# Patient Record
Sex: Female | Born: 1965 | Race: White | Hispanic: No | Marital: Single | Smoking: Current every day smoker
Health system: Southern US, Community
[De-identification: ages and names within clinical notes are randomized; demographics above are authoritative.]

## PROBLEM LIST (undated history)

## (undated) DIAGNOSIS — M7989 Other specified soft tissue disorders: Secondary | ICD-10-CM

## (undated) DIAGNOSIS — L309 Dermatitis, unspecified: Secondary | ICD-10-CM

## (undated) DIAGNOSIS — F329 Major depressive disorder, single episode, unspecified: Secondary | ICD-10-CM

## (undated) DIAGNOSIS — J40 Bronchitis, not specified as acute or chronic: Secondary | ICD-10-CM

## (undated) DIAGNOSIS — F17200 Nicotine dependence, unspecified, uncomplicated: Secondary | ICD-10-CM

## (undated) DIAGNOSIS — F4310 Post-traumatic stress disorder, unspecified: Secondary | ICD-10-CM

## (undated) DIAGNOSIS — J449 Chronic obstructive pulmonary disease, unspecified: Secondary | ICD-10-CM

## (undated) DIAGNOSIS — F1190 Opioid use, unspecified, uncomplicated: Secondary | ICD-10-CM

## (undated) DIAGNOSIS — I10 Essential (primary) hypertension: Secondary | ICD-10-CM

## (undated) HISTORY — PX: BUNIONECTOMY: SHX129

## (undated) HISTORY — PX: APPENDECTOMY: SHX54

---

## 2008-08-03 MED ORDER — OXYCODONE-ACETAMINOPHEN 10 MG-325 MG TAB
10-325 mg | ORAL_TABLET | Freq: Four times a day (QID) | ORAL | Status: DC | PRN
Start: 2008-08-03 — End: 2016-09-03

## 2008-08-03 MED ORDER — HYDROCODONE-ACETAMINOPHEN 7.5 MG-325 MG TAB
ORAL_TABLET | Freq: Four times a day (QID) | ORAL | Status: AC | PRN
Start: 2008-08-03 — End: ?

## 2008-08-03 NOTE — Progress Notes (Signed)
Pt here for left foot pain.  Jacqueline Monroe
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
## 2010-05-18 ENCOUNTER — Emergency Department (HOSPITAL_COMMUNITY)
Admission: EM | Admit: 2010-05-18 | Discharge: 2010-05-18 | Disposition: A | Discharge status: Home / Self Care | Payer: Self-pay | Attending: Emergency Medicine | Admitting: Emergency Medicine

## 2010-05-18 DIAGNOSIS — K029 Dental caries, unspecified: Secondary | ICD-10-CM | POA: Insufficient documentation

## 2010-05-18 DIAGNOSIS — K089 Disorder of teeth and supporting structures, unspecified: Secondary | ICD-10-CM | POA: Insufficient documentation

## 2010-11-02 ENCOUNTER — Other Ambulatory Visit (HOSPITAL_COMMUNITY): Payer: Self-pay | Admitting: Family Medicine

## 2010-11-02 DIAGNOSIS — Z1231 Encounter for screening mammogram for malignant neoplasm of breast: Secondary | ICD-10-CM

## 2010-11-09 ENCOUNTER — Ambulatory Visit (HOSPITAL_COMMUNITY): Payer: Self-pay

## 2010-11-15 ENCOUNTER — Emergency Department (HOSPITAL_COMMUNITY)
Admission: EM | Admit: 2010-11-15 | Discharge: 2010-11-15 | Disposition: A | Discharge status: Home / Self Care | Payer: Self-pay | Attending: Emergency Medicine | Admitting: Emergency Medicine

## 2010-11-15 DIAGNOSIS — A499 Bacterial infection, unspecified: Secondary | ICD-10-CM | POA: Insufficient documentation

## 2010-11-15 DIAGNOSIS — Z79899 Other long term (current) drug therapy: Secondary | ICD-10-CM | POA: Insufficient documentation

## 2010-11-15 DIAGNOSIS — B9689 Other specified bacterial agents as the cause of diseases classified elsewhere: Secondary | ICD-10-CM | POA: Insufficient documentation

## 2010-11-15 DIAGNOSIS — N760 Acute vaginitis: Secondary | ICD-10-CM | POA: Insufficient documentation

## 2010-11-15 DIAGNOSIS — R109 Unspecified abdominal pain: Secondary | ICD-10-CM | POA: Insufficient documentation

## 2010-11-15 LAB — POCT PREGNANCY, URINE: Preg Test, Ur: NEGATIVE

## 2010-11-15 LAB — WET PREP, GENITAL

## 2010-11-15 LAB — URINALYSIS, ROUTINE W REFLEX MICROSCOPIC
Bilirubin Urine: NEGATIVE
Glucose, UA: NEGATIVE mg/dL
Ketones, ur: NEGATIVE mg/dL
pH: 6 (ref 5.0–8.0)

## 2010-11-16 LAB — GC/CHLAMYDIA PROBE AMP, GENITAL
Chlamydia, DNA Probe: NEGATIVE
GC Probe Amp, Genital: NEGATIVE

## 2010-11-23 ENCOUNTER — Ambulatory Visit (HOSPITAL_COMMUNITY)
Admission: RE | Admit: 2010-11-23 | Discharge: 2010-11-23 | Disposition: A | Discharge status: Home / Self Care | Payer: Self-pay | Source: Ambulatory Visit | Attending: Family Medicine | Admitting: Family Medicine

## 2010-11-23 DIAGNOSIS — Z1231 Encounter for screening mammogram for malignant neoplasm of breast: Secondary | ICD-10-CM

## 2011-03-03 ENCOUNTER — Other Ambulatory Visit: Payer: Self-pay

## 2011-03-03 ENCOUNTER — Emergency Department (HOSPITAL_COMMUNITY)
Admission: EM | Admit: 2011-03-03 | Discharge: 2011-03-03 | Disposition: A | Discharge status: Other Acute Inpatient Hospital | Payer: Self-pay | Attending: Emergency Medicine | Admitting: Emergency Medicine

## 2011-03-03 ENCOUNTER — Encounter (HOSPITAL_COMMUNITY): Payer: Self-pay | Admitting: *Deleted

## 2011-03-03 DIAGNOSIS — F29 Unspecified psychosis not due to a substance or known physiological condition: Secondary | ICD-10-CM | POA: Insufficient documentation

## 2011-03-03 DIAGNOSIS — F4310 Post-traumatic stress disorder, unspecified: Secondary | ICD-10-CM | POA: Insufficient documentation

## 2011-03-03 DIAGNOSIS — J45909 Unspecified asthma, uncomplicated: Secondary | ICD-10-CM | POA: Insufficient documentation

## 2011-03-03 DIAGNOSIS — Z79899 Other long term (current) drug therapy: Secondary | ICD-10-CM | POA: Insufficient documentation

## 2011-03-03 DIAGNOSIS — F341 Dysthymic disorder: Secondary | ICD-10-CM | POA: Insufficient documentation

## 2011-03-03 HISTORY — DX: Post-traumatic stress disorder, unspecified: F43.10

## 2011-03-03 HISTORY — DX: Major depressive disorder, single episode, unspecified: F32.9

## 2011-03-03 LAB — COMPREHENSIVE METABOLIC PANEL WITH GFR
ALT: 24 U/L (ref 0–35)
AST: 30 U/L (ref 0–37)
Calcium: 9.2 mg/dL (ref 8.4–10.5)
Creatinine, Ser: 0.92 mg/dL (ref 0.50–1.10)
GFR calc Af Amer: 85 mL/min — ABNORMAL LOW (ref >90)
Sodium: 136 meq/L (ref 135–145)
Total Protein: 7.5 g/dL (ref 6.0–8.3)

## 2011-03-03 LAB — CBC
HCT: 32.8 % — ABNORMAL LOW (ref 36.0–46.0)
Hemoglobin: 11.1 g/dL — ABNORMAL LOW (ref 12.0–15.0)
MCH: 29.7 pg (ref 26.0–34.0)
MCHC: 33.8 g/dL (ref 30.0–36.0)
MCV: 87.7 fL (ref 78.0–100.0)
Platelets: 321 K/uL (ref 150–400)
RBC: 3.74 MIL/uL — ABNORMAL LOW (ref 3.87–5.11)
RDW: 13.9 % (ref 11.5–15.5)
WBC: 6.7 K/uL (ref 4.0–10.5)

## 2011-03-03 LAB — URINALYSIS, ROUTINE W REFLEX MICROSCOPIC
Glucose, UA: NEGATIVE mg/dL
Hgb urine dipstick: NEGATIVE
Ketones, ur: NEGATIVE mg/dL
Leukocytes, UA: NEGATIVE
Nitrite: NEGATIVE
Protein, ur: NEGATIVE mg/dL
Specific Gravity, Urine: 1.018 (ref 1.005–1.030)
Urobilinogen, UA: 0.2 mg/dL (ref 0.0–1.0)
pH: 6 (ref 5.0–8.0)

## 2011-03-03 LAB — DIFFERENTIAL
Basophils Absolute: 0 10*3/uL (ref 0.0–0.1)
Basophils Relative: 1 % (ref 0–1)
Eosinophils Absolute: 0.2 K/uL (ref 0.0–0.7)
Eosinophils Relative: 4 % (ref 0–5)
Lymphocytes Relative: 24 % (ref 12–46)
Lymphs Abs: 1.6 K/uL (ref 0.7–4.0)
Monocytes Absolute: 0.6 10*3/uL (ref 0.1–1.0)
Monocytes Relative: 9 % (ref 3–12)
Neutro Abs: 4.3 K/uL (ref 1.7–7.7)
Neutrophils Relative %: 63 % (ref 43–77)

## 2011-03-03 LAB — ETHANOL: Alcohol, Ethyl (B): <11 mg/dL (ref 0–11)

## 2011-03-03 LAB — RAPID URINE DRUG SCREEN, HOSP PERFORMED
Amphetamines: NOT DETECTED
Barbiturates: NOT DETECTED
Benzodiazepines: POSITIVE — AB
Cocaine: NOT DETECTED
Opiates: NOT DETECTED
Tetrahydrocannabinol: NOT DETECTED

## 2011-03-03 LAB — COMPREHENSIVE METABOLIC PANEL
Albumin: 3.6 g/dL (ref 3.5–5.2)
Alkaline Phosphatase: 91 U/L (ref 39–117)
BUN: 9 mg/dL (ref 6–23)
CO2: 26 mEq/L (ref 19–32)
Chloride: 101 mEq/L (ref 96–112)
GFR calc non Af Amer: 74 mL/min — ABNORMAL LOW (ref >90)
Glucose, Bld: 79 mg/dL (ref 70–99)
Potassium: 3.5 mEq/L (ref 3.5–5.1)
Total Bilirubin: 0.4 mg/dL (ref 0.3–1.2)
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
## 2011-03-03 NOTE — ED Notes (Signed)
Results faxed to [facility] 

## 2011-03-03 NOTE — ED Notes (Signed)
Spoke with Pam @ [facility], pt may return, [place], [place] informed

## 2011-03-03 NOTE — ED Notes (Signed)
Pt states "I was hearing voices yesterday but when the police came, there was no one there"; per [employer], escort with pt, "she was supposed to be brought over last pm"; per IVC papers "she was brought to [facility] by [employer] after making 8 calls to 911 today complaining that she is seeing chopped off heads in her yard, that people are coming in her yard with guns, that people are in her house and leaving when police arrive, she is hearing voices telling her she is terrible and ugly."

## 2011-03-07 NOTE — ED Provider Notes (Signed)
Medical screening examination/treatment/procedure(s) were performed by non-physician practitioner and as supervising physician I was immediately available for consultation/collaboration. Faigy Stretch Y.   Gavin Pound. Conrado Nance, MD 03/07/11 1810

## 2011-04-04 ENCOUNTER — Ambulatory Visit: Payer: Self-pay | Admitting: Emergency Medicine

## 2011-12-07 ENCOUNTER — Emergency Department (HOSPITAL_COMMUNITY)
Admission: EM | Admit: 2011-12-07 | Discharge: 2011-12-07 | Disposition: A | Discharge status: Home / Self Care | Payer: Medicaid Other | Attending: Emergency Medicine | Admitting: Emergency Medicine

## 2011-12-07 ENCOUNTER — Emergency Department (HOSPITAL_COMMUNITY): Payer: Medicaid Other

## 2011-12-07 DIAGNOSIS — J45909 Unspecified asthma, uncomplicated: Secondary | ICD-10-CM | POA: Insufficient documentation

## 2011-12-07 DIAGNOSIS — Z79899 Other long term (current) drug therapy: Secondary | ICD-10-CM | POA: Insufficient documentation

## 2011-12-07 DIAGNOSIS — Y92009 Unspecified place in unspecified non-institutional (private) residence as the place of occurrence of the external cause: Secondary | ICD-10-CM | POA: Insufficient documentation

## 2011-12-07 DIAGNOSIS — Y939 Activity, unspecified: Secondary | ICD-10-CM | POA: Insufficient documentation

## 2011-12-07 DIAGNOSIS — F329 Major depressive disorder, single episode, unspecified: Secondary | ICD-10-CM | POA: Insufficient documentation

## 2011-12-07 DIAGNOSIS — S8263XA Displaced fracture of lateral malleolus of unspecified fibula, initial encounter for closed fracture: Secondary | ICD-10-CM | POA: Insufficient documentation

## 2011-12-07 DIAGNOSIS — F4310 Post-traumatic stress disorder, unspecified: Secondary | ICD-10-CM | POA: Insufficient documentation

## 2011-12-07 DIAGNOSIS — F17200 Nicotine dependence, unspecified, uncomplicated: Secondary | ICD-10-CM | POA: Insufficient documentation

## 2011-12-07 DIAGNOSIS — W010XXA Fall on same level from slipping, tripping and stumbling without subsequent striking against object, initial encounter: Secondary | ICD-10-CM | POA: Insufficient documentation

## 2011-12-07 MED ORDER — OXYCODONE-ACETAMINOPHEN 5-325 MG PO TABS: 1.0000 | ORAL_TABLET | Freq: Four times a day (QID) | ORAL | Status: DC | PRN

## 2011-12-07 MED ORDER — OXYCODONE-ACETAMINOPHEN 5-325 MG PO TABS
2.0000 | ORAL_TABLET | Freq: Once | ORAL | Status: AC
  Administered 2011-12-07: 2 via ORAL
  Filled 2011-12-07: qty 2

## 2011-12-07 NOTE — Progress Notes (Signed)
Orthopedic Tech Progress Note Patient Details:  Rebekah Murillo 07-04-1965 [id number]  Ortho Devices Type of Ortho Device: CAM walker;Crutches Ortho Device/Splint Location: RIGHT CAM WALKER , CRUTCHES  Ortho Device/Splint Interventions: Application   Cammer, Mickie Bail 12/07/2011, 11:45 AM

## 2011-12-07 NOTE — ED Notes (Signed)
Pt was walking on wood floors in apartment and cat ran in front of her and she twisted her ankle. This happened last night. Rt ankle is edematous. No bruising noted at this time. Pt pain score 10/10

## 2011-12-07 NOTE — ED Provider Notes (Addendum)
History     CSN: [id number]  Arrival date & time 12/07/11  1004   First MD Initiated Contact with Patient 12/07/11 1005      No chief complaint on file.   (Consider location/radiation/quality/duration/timing/severity/associated sxs/prior treatment) Patient is a 46 y.o. female presenting with ankle pain. The history is provided by the patient.  Ankle Pain  The incident occurred yesterday. The incident occurred at home. The injury mechanism was a fall (twisted her ankle and fell). The pain is present in the right ankle. The quality of the pain is described as throbbing, sharp and aching. The pain is at a severity of 10/10. The pain is severe. The pain has been constant since onset. Associated symptoms include inability to bear weight. Pertinent negatives include no numbness, no muscle weakness and no loss of sensation. The symptoms are aggravated by activity, bearing weight and palpation. She has tried rest for the symptoms. The treatment provided no relief.    Past Medical History  Diagnosis Date  . Major depression   . PTSD (post-traumatic stress disorder)   . Asthma     Past Surgical History  Procedure Date  . Appendectomy     No family history on file.  History  Substance Use Topics  . Smoking status: Current Every Day Smoker -- 2.0 packs/day  . Smokeless tobacco: Not on file  . Alcohol Use: 1.8 oz/week    3 Cans of beer per week    OB History    Grav Para Term Preterm Abortions TAB SAB Ect Mult Living                  Review of Systems  Constitutional: Negative for fever.  Musculoskeletal: Positive for joint swelling.  Neurological: Negative for numbness.  All other systems reviewed and are negative.    Allergies  Penicillins  Home Medications   Current Outpatient Rx  Name  Route  Sig  Dispense  Refill  . TYLENOL PM EXTRA STRENGTH PO   Oral   Take 2 tablets by mouth at bedtime as needed. For sleep.         Marland Kitchen GABAPENTIN 600 MG PO TABS   Oral  Take 600 mg by mouth 3 (three) times daily.         Marland Kitchen HYDROXYZINE PAMOATE 50 MG PO CAPS   Oral   Take 50 mg by mouth 3 (three) times daily as needed. For anxiety.         Marland Kitchen LAMOTRIGINE 100 MG PO TABS   Oral   Take 50 mg by mouth 2 (two) times daily.         . SERTRALINE HCL 100 MG PO TABS   Oral   Take 100 mg by mouth daily.         Marland Kitchen TEMAZEPAM 15 MG PO CAPS   Oral   Take 30 mg by mouth at bedtime. For sleep.           BP 130/82  Pulse 76  Resp 16  SpO2 98%  Physical Exam  Nursing note and vitals reviewed. Constitutional: She is oriented to person, place, and time. She appears well-developed and well-nourished. No distress.  HENT:  Head: Normocephalic and atraumatic.  Eyes: EOM are normal. Pupils are equal, round, and reactive to light.  Musculoskeletal:       Right knee: Normal.       Right ankle: She exhibits decreased range of motion, swelling and ecchymosis. tenderness. Lateral malleolus and medial malleolus tenderness  found.       2+ right sided DP and PT pulses  Neurological: She is alert and oriented to person, place, and time. She has normal strength. No sensory deficit.  Skin: Skin is warm and dry. No rash noted. No erythema.    ED Course  Procedures (including critical care time)  Labs Reviewed - No data to display Dg Ankle Complete Right  12/07/2011  *RADIOLOGY REPORT*  Clinical Data: Lateral pain and swelling, twisted ankle 1 day ago  RIGHT ANKLE - COMPLETE 3+ VIEW  Comparison: None  Findings: Osseous mineralization normal. Transverse fracture lateral malleolus, minimally distracted. Slight obliquity on the lateral view. No definite additional fracture or dislocation. Small spur at the anterior margin of the distal tibial articular surface. Small plantar calcaneal spur. Soft tissue swelling, greatest laterally.  IMPRESSION: Transverse fracture of the lateral malleolus.   Original Report Authenticated By: Ulyses Southward, M.D.      1. Fractured lateral  malleolus       MDM   Patient with mechanical injury last night to her right ankle. She states she was walking and twisted it. It is swollen and tender and she's been unable to walk on it due to pain. No fibular head tenderness, knee pain or other injuries noted. She is neurovascularly intact and has movement and sensation in her distal foot. Plain films pending and patient given pain control  11:01 AM Lateral mall tenderness.  Will place in cam walker and give f/u and crutches and pain control.      Gwyneth Sprout, MD 12/07/11 1102  Gwyneth Sprout, MD 12/07/11 1114

## 2011-12-07 NOTE — ED Notes (Signed)
Pt fell last night and twisted right foot.

## 2011-12-07 NOTE — ED Notes (Signed)
ZOX:WR60<AV> Expected date:12/07/11<BR> Expected time: 9:55 AM<BR> Means of arrival:Ambulance<BR> Comments:<BR> Fall/ankle pain

## 2012-03-16 ENCOUNTER — Emergency Department (HOSPITAL_COMMUNITY)
Admission: EM | Admit: 2012-03-16 | Discharge: 2012-03-16 | Disposition: A | Discharge status: Home / Self Care | Payer: Medicaid Other | Attending: Emergency Medicine | Admitting: Emergency Medicine

## 2012-03-16 ENCOUNTER — Encounter (HOSPITAL_COMMUNITY): Payer: Self-pay | Admitting: Emergency Medicine

## 2012-03-16 DIAGNOSIS — S99919A Unspecified injury of unspecified ankle, initial encounter: Secondary | ICD-10-CM | POA: Insufficient documentation

## 2012-03-16 DIAGNOSIS — R209 Unspecified disturbances of skin sensation: Secondary | ICD-10-CM | POA: Insufficient documentation

## 2012-03-16 DIAGNOSIS — Y929 Unspecified place or not applicable: Secondary | ICD-10-CM | POA: Insufficient documentation

## 2012-03-16 DIAGNOSIS — J45909 Unspecified asthma, uncomplicated: Secondary | ICD-10-CM | POA: Insufficient documentation

## 2012-03-16 DIAGNOSIS — Y939 Activity, unspecified: Secondary | ICD-10-CM | POA: Insufficient documentation

## 2012-03-16 DIAGNOSIS — Z8659 Personal history of other mental and behavioral disorders: Secondary | ICD-10-CM | POA: Insufficient documentation

## 2012-03-16 DIAGNOSIS — G8929 Other chronic pain: Secondary | ICD-10-CM | POA: Insufficient documentation

## 2012-03-16 DIAGNOSIS — S8990XA Unspecified injury of unspecified lower leg, initial encounter: Secondary | ICD-10-CM | POA: Insufficient documentation

## 2012-03-16 DIAGNOSIS — X500XXA Overexertion from strenuous movement or load, initial encounter: Secondary | ICD-10-CM | POA: Insufficient documentation

## 2012-03-16 DIAGNOSIS — W010XXA Fall on same level from slipping, tripping and stumbling without subsequent striking against object, initial encounter: Secondary | ICD-10-CM | POA: Insufficient documentation

## 2012-03-16 DIAGNOSIS — F17200 Nicotine dependence, unspecified, uncomplicated: Secondary | ICD-10-CM | POA: Insufficient documentation

## 2012-03-16 MED ORDER — KETOROLAC TROMETHAMINE 60 MG/2ML IM SOLN
60.0000 mg | Freq: Once | INTRAMUSCULAR | Status: AC
  Administered 2012-03-16: 60 mg via INTRAMUSCULAR
  Filled 2012-03-16: qty 2

## 2012-03-16 MED ORDER — MORPHINE SULFATE 4 MG/ML IJ SOLN
6.0000 mg | Freq: Once | INTRAMUSCULAR | Status: AC
  Administered 2012-03-16: 4 mg via INTRAMUSCULAR
  Filled 2012-03-16: qty 1

## 2012-03-16 MED ORDER — OXYCODONE-ACETAMINOPHEN 5-325 MG PO TABS: 1.0000 | ORAL_TABLET | Freq: Four times a day (QID) | ORAL | Status: DC | PRN

## 2012-03-16 MED ORDER — IBUPROFEN 800 MG PO TABS: 800.0000 mg | ORAL_TABLET | Freq: Three times a day (TID) | ORAL | Status: DC | PRN

## 2012-03-16 NOTE — ED Notes (Signed)
QIO:NG29<BM> Expected date:03/16/12<BR> Expected time: 2:15 PM<BR> Means of arrival:Ambulance<BR> Comments:<BR> Pain in feet

## 2012-03-16 NOTE — ED Notes (Signed)
Pt c/o bilateral feet pain x 3 months from previous breaks.  Denies recent injury.

## 2012-03-16 NOTE — ED Provider Notes (Signed)
History     CSN: [id number]  Arrival date & time 03/16/12  1408   First MD Initiated Contact with Patient 03/16/12 1429      Chief Complaint  Patient presents with  . Foot Pain    (Consider location/radiation/quality/duration/timing/severity/associated sxs/prior treatment) HPI Patient is a 47 yo female with a history of depression who presents today with bilateral foot pain.  Her right ankle was broken three months ago when she tripped over her cat and twisted her ankle.  Since then it's been hurting and she would like to get surgery on it but her orthopedist won't do it until she stops smoking so much.  Her left foot has been hurting for the past 3-4 years with numbness and tingling.  She got imaging done by her orthopedist on that foot and all she was told was that she would have to see another foot doctor for her left foot.  She is unsure if she has a fracture or not.  She is tired of being a mermaid where she is unable to use both of her feet and when she tries to focus on healing on foot than the other one gets messed up.  She smokes two packs a day and would like some help quitting with a patch.  Past Medical History  Diagnosis Date  . Major depression   . PTSD (post-traumatic stress disorder)   . Asthma     Past Surgical History  Procedure Laterality Date  . Appendectomy      No family history on file.  History  Substance Use Topics  . Smoking status: Current Every Day Smoker -- 2.00 packs/day  . Smokeless tobacco: Not on file  . Alcohol Use: 1.8 oz/week    3 Cans of beer per week    OB History   Grav Para Term Preterm Abortions TAB SAB Ect Mult Living                  Review of Systems All other systems negative except as documented in the HPI. All pertinent positives and negatives as reviewed in the HPI.  Allergies  Penicillins  Home Medications  No current outpatient prescriptions on file.  BP 158/94  Pulse 99  Temp(Src) 98.5 F (36.9 C) (Oral)   Resp 18  SpO2 100%  Physical Exam  Nursing note and vitals reviewed. Constitutional: She is oriented to person, place, and time. She appears well-developed and well-nourished. No distress.  HENT:  Head: Normocephalic and atraumatic.  Eyes: Right eye exhibits no discharge. Left eye exhibits no discharge.  Neck: Normal range of motion.  Cardiovascular: Normal rate, regular rhythm and normal heart sounds.  Exam reveals no gallop and no friction rub.   No murmur heard. Pulmonary/Chest: Effort normal and breath sounds normal. No respiratory distress. She has no wheezes. She has no rales. She exhibits no tenderness.  Musculoskeletal:       Right ankle: She exhibits decreased range of motion. Tenderness.       Left ankle: She exhibits decreased range of motion. Tenderness.       Feet:  Neurological: She is alert and oriented to person, place, and time.  Skin: She is not diaphoretic.  Psychiatric: She has a normal mood and affect. Her behavior is normal. Judgment and thought content normal.    ED Course  Procedures (including critical care time) Patient referred to another orthopedist. The patient requested admission for her foot pain. I advised her that this is not an issue  that we admit people to the hospital for. She is given pain control. Told to return here as needed.   MDM          Carlyle Dolly, PA-C 03/17/12 1525

## 2012-03-16 NOTE — ED Notes (Signed)
Pt states that she broke her right ankle 3 months ago and states that it still hurts.  Pt states that she had cosmetic surgery on her left foot 3 years ago.  States that her doctor said he could do surgery but wouldn't until she stopped smoking 2 packs a day because that is affecting her healing.  States that she knows that her smoking doesn't affect that.

## 2012-03-19 ENCOUNTER — Emergency Department (HOSPITAL_COMMUNITY)
Admission: EM | Admit: 2012-03-19 | Discharge: 2012-03-19 | Disposition: A | Discharge status: Home / Self Care | Payer: Medicaid Other | Source: Home / Self Care

## 2012-03-19 ENCOUNTER — Encounter (HOSPITAL_COMMUNITY): Payer: Self-pay | Admitting: *Deleted

## 2012-03-19 DIAGNOSIS — M79609 Pain in unspecified limb: Secondary | ICD-10-CM

## 2012-03-19 DIAGNOSIS — M79672 Pain in left foot: Secondary | ICD-10-CM

## 2012-03-19 MED ORDER — NAPROXEN 500 MG PO TABS: 500.0000 mg | ORAL_TABLET | Freq: Three times a day (TID) | ORAL | Status: DC

## 2012-03-19 NOTE — ED Provider Notes (Signed)
History     CSN: [id number]  Arrival date & time 03/19/12  1249   First MD Initiated Contact with Patient 03/19/12 1310      Chief Complaint  Patient presents with  . Pain    (Consider location/radiation/quality/duration/timing/severity/associated sxs/prior treatment) HPI 47 year old female with history of heavy smoking, depression who had bilateral ankle fracture in November 2013 and was following with orthopedics was seen recently in the ED for pain over left foot. She has CAM walker on right. She had an x-ray of her feet done in the orthopedics clinic in January. She however wished to see a different orthopedic surgeon to get an opinion with regards to need for surgery. She is here for request for pain medicine. Informs she does not require up Percocet and Advil does not help her much. Denies any fever, chills, headache, blurry vision, dizziness, chest pain, palpitations, shortness of breath, abdominal pain, nausea, vomiting, bowel or urinary symptoms.  He used to smoke almost about 2 packs per day Past Medical History  Diagnosis Date  . Major depression   . PTSD (post-traumatic stress disorder)   . Asthma     Past Surgical History  Procedure Laterality Date  . Appendectomy      History reviewed. No pertinent family history.  History  Substance Use Topics  . Smoking status: Current Every Day Smoker -- 2.00 packs/day  . Smokeless tobacco: Not on file  . Alcohol Use: 1.8 oz/week    3 Cans of beer per week    OB History   Grav Para Term Preterm Abortions TAB SAB Ect Mult Living                  Review of Systems  Allergies  Penicillins  Home Medications   Current Outpatient Rx  Name  Route  Sig  Dispense  Refill  . ibuprofen (ADVIL,MOTRIN) 800 MG tablet   Oral   Take 1 tablet (800 mg total) by mouth every 8 (eight) hours as needed for pain.   21 tablet   0   . oxyCODONE-acetaminophen (PERCOCET/ROXICET) 5-325 MG per tablet   Oral   Take 1 tablet by mouth  every 6 (six) hours as needed for pain.   15 tablet   0     BP 150/137  Pulse 82  Temp(Src) 98.7 F (37.1 C) (Oral)  Resp 18  SpO2 99% Repeat blood pressure 157/80 mmhg  Physical Exam Middle-aged female in no acute distress HEENT: No pallor, moist oral Chest: Clear to auscultation bilaterally no added sounds CVS: Normal S1 and S2, no murmurs rub or gallop Abdomen: Soft, nontender, nondistended, bowel sounds present Extremities: CAM walker on right, some tenderness to palpation over the left ankle with limited range of motion on both ankles CNS: AAO x3 ED Course  Procedures (including critical care time)  Labs Reviewed - No data to display No results found.   No diagnosis found.  Bilateral foot pain Has fracture of right ankle and chronic pain over left foot. Was seeing orthopedic surgeon and requesting to see a different surgeon for second opinion. A referral was given by ED on her recent visit. Patient is planned to make an appointment soon . Will give her a short course of Naprosyn for her pain.  Elevated blood pressure Possibly to the right pain as well. Will follow her up in one month and have ordered for labs including TMP, CBC, lipid panel and hemoglobin A1c  Tobacco use Patient is a heavy smoker and  have counseled strongly on quitting smoking. She does not want any medications her resources at this time and we'll try to quit on her own.  MDM          Eddie North, MD 03/19/12 1404

## 2012-03-19 NOTE — ED Notes (Signed)
Pt reports her feet have bilateral foot pain -in  November her right ankle/ foot was broken and her left foot had cosmetic surgery three years ago  - states dr. Catalina Pizza her that bones were not healing due to smoking 2 packs a day.

## 2012-03-19 NOTE — ED Provider Notes (Signed)
Medical screening examination/treatment/procedure(s) were performed by non-physician practitioner and as supervising physician I was immediately available for consultation/collaboration.  Donnetta Hutching, MD 03/19/12 [phone]

## 2012-03-22 ENCOUNTER — Other Ambulatory Visit: Payer: Self-pay | Admitting: Orthopedic Surgery

## 2012-03-22 ENCOUNTER — Other Ambulatory Visit: Payer: Self-pay

## 2012-03-22 DIAGNOSIS — R531 Weakness: Secondary | ICD-10-CM

## 2012-03-22 DIAGNOSIS — M25571 Pain in right ankle and joints of right foot: Secondary | ICD-10-CM

## 2012-03-22 DIAGNOSIS — R609 Edema, unspecified: Secondary | ICD-10-CM

## 2012-03-22 DIAGNOSIS — Z1231 Encounter for screening mammogram for malignant neoplasm of breast: Secondary | ICD-10-CM

## 2012-03-27 ENCOUNTER — Ambulatory Visit
Admission: RE | Admit: 2012-03-27 | Discharge: 2012-03-27 | Disposition: A | Discharge status: Home / Self Care | Payer: Medicaid Other | Source: Ambulatory Visit | Attending: Orthopedic Surgery | Admitting: Orthopedic Surgery

## 2012-03-27 DIAGNOSIS — M25571 Pain in right ankle and joints of right foot: Secondary | ICD-10-CM

## 2012-03-27 DIAGNOSIS — R531 Weakness: Secondary | ICD-10-CM

## 2012-04-16 ENCOUNTER — Other Ambulatory Visit: Payer: Self-pay | Admitting: Orthopedic Surgery

## 2012-04-17 ENCOUNTER — Encounter (HOSPITAL_COMMUNITY): Payer: Self-pay | Admitting: Pharmacy Technician

## 2012-04-18 ENCOUNTER — Ambulatory Visit: Payer: Medicaid Other

## 2012-04-19 ENCOUNTER — Encounter (HOSPITAL_COMMUNITY)
Admission: RE | Admit: 2012-04-19 | Discharge: 2012-04-19 | Disposition: A | Discharge status: Home / Self Care | Payer: Medicaid Other | Source: Ambulatory Visit | Attending: Orthopedic Surgery | Admitting: Orthopedic Surgery

## 2012-04-19 ENCOUNTER — Encounter (HOSPITAL_COMMUNITY)
Admission: RE | Admit: 2012-04-19 | Discharge: 2012-04-19 | Disposition: A | Discharge status: Home / Self Care | Payer: Medicaid Other | Source: Ambulatory Visit | Attending: Anesthesiology | Admitting: Anesthesiology

## 2012-04-19 ENCOUNTER — Encounter (HOSPITAL_COMMUNITY): Payer: Self-pay

## 2012-04-19 HISTORY — DX: Dermatitis, unspecified: L30.9

## 2012-04-19 LAB — CBC
Hemoglobin: 14.1 g/dL (ref 12.0–15.0)
MCHC: 34.4 g/dL (ref 30.0–36.0)
RBC: 4.55 MIL/uL (ref 3.87–5.11)

## 2012-04-19 LAB — SURGICAL PCR SCREEN
MRSA, PCR: NEGATIVE
Staphylococcus aureus: NEGATIVE

## 2012-04-19 NOTE — Pre-Procedure Instructions (Signed)
Rebekah Murillo  04/19/2012   Your procedure is scheduled on:  Tuesday, April 1st  Report to [facility] at 0900 AM.  Call this number if you have problems the morning of surgery: [phone]   Remember:   Do not eat food or drink liquids after midnight.    Take these medicines the morning of surgery with A SIP OF WATER: neurontin   Do not wear jewelry, make-up or nail polish.  Do not wear lotions, powders, or perfumes. You may wear deodorant.  Do not shave 48 hours prior to surgery. Men may shave face and neck.  Do not bring valuables to the hospital.  Contacts, dentures or bridgework may not be worn into surgery.  Leave suitcase in the car. After surgery it may be brought to your room.  For patients admitted to the hospital, checkout time is 11:00 AM the day of Discharge.    Patients discharged the day of surgery will not be allowed to drive home.    Special Instructions: Shower using CHG 2 nights before surgery and the night before surgery.  If you shower the day of surgery use CHG.  Use special wash - you have one bottle of CHG for all showers.  You should use approximately 1/3 of the bottle for each shower.   Please read over the following fact sheets that you were given: Pain Booklet, Coughing and Deep Breathing, MRSA Information and Surgical Site Infection Prevention

## 2012-04-22 NOTE — Progress Notes (Signed)
Anesthesia Chart Review:  Patient is a 47 year old female scheduled for right ankle fracture non-union takedown, grafting, plating by Dr. August Saucer on 04/23/12.  History includes obesity, major depression, PTSD, asthma, smoking, eczema.   Preoperative labs noted. I've asked Short Stay staff to verify pregnancy status history--and order serum pregnancy test if indicated per anesthesia protocol.  EKG on 04/19/12 showed NSR, minimal voltage criteria for LVH.  CXR on 04/19/12 showed: Likely small granuloma in the right base. Follow-up imaging is recommended to assess for stability. I called result to Selena Batten at [facility] who will have Dr. August Saucer review for further recommendations.    She will be evaluated by her assigned anesthesiologist on the day of surgery to discuss the definitive anesthesia plan.  Velna Ochs [facility]/Anesthesiology Phone [phone] 04/22/2012 11:08 AM

## 2012-04-23 ENCOUNTER — Observation Stay (HOSPITAL_COMMUNITY): Payer: Medicaid Other

## 2012-04-23 ENCOUNTER — Ambulatory Visit (HOSPITAL_COMMUNITY): Payer: Medicaid Other | Admitting: Certified Registered"

## 2012-04-23 ENCOUNTER — Encounter (HOSPITAL_COMMUNITY): Payer: Self-pay | Admitting: Certified Registered"

## 2012-04-23 ENCOUNTER — Observation Stay (HOSPITAL_COMMUNITY)
Admission: RE | Admit: 2012-04-23 | Discharge: 2012-04-24 | Disposition: A | Discharge status: Home / Self Care | Payer: Medicaid Other | Source: Ambulatory Visit | Attending: Orthopedic Surgery | Admitting: Orthopedic Surgery

## 2012-04-23 ENCOUNTER — Ambulatory Visit (HOSPITAL_COMMUNITY): Payer: Medicaid Other

## 2012-04-23 ENCOUNTER — Encounter (HOSPITAL_COMMUNITY)
Admission: RE | Disposition: A | Discharge status: Home / Self Care | Payer: Self-pay | Source: Ambulatory Visit | Attending: Orthopedic Surgery

## 2012-04-23 ENCOUNTER — Encounter (HOSPITAL_COMMUNITY): Payer: Self-pay | Admitting: Vascular Surgery

## 2012-04-23 DIAGNOSIS — F329 Major depressive disorder, single episode, unspecified: Secondary | ICD-10-CM | POA: Diagnosis present

## 2012-04-23 DIAGNOSIS — S82891S Other fracture of right lower leg, sequela: Secondary | ICD-10-CM

## 2012-04-23 DIAGNOSIS — F17200 Nicotine dependence, unspecified, uncomplicated: Secondary | ICD-10-CM | POA: Insufficient documentation

## 2012-04-23 DIAGNOSIS — R9389 Abnormal findings on diagnostic imaging of other specified body structures: Secondary | ICD-10-CM

## 2012-04-23 DIAGNOSIS — J4489 Other specified chronic obstructive pulmonary disease: Secondary | ICD-10-CM | POA: Insufficient documentation

## 2012-04-23 DIAGNOSIS — Z720 Tobacco use: Secondary | ICD-10-CM | POA: Diagnosis present

## 2012-04-23 DIAGNOSIS — Z01810 Encounter for preprocedural cardiovascular examination: Secondary | ICD-10-CM | POA: Insufficient documentation

## 2012-04-23 DIAGNOSIS — J449 Chronic obstructive pulmonary disease, unspecified: Secondary | ICD-10-CM | POA: Diagnosis present

## 2012-04-23 DIAGNOSIS — R918 Other nonspecific abnormal finding of lung field: Secondary | ICD-10-CM

## 2012-04-23 DIAGNOSIS — M25579 Pain in unspecified ankle and joints of unspecified foot: Secondary | ICD-10-CM | POA: Insufficient documentation

## 2012-04-23 DIAGNOSIS — F1010 Alcohol abuse, uncomplicated: Secondary | ICD-10-CM | POA: Insufficient documentation

## 2012-04-23 DIAGNOSIS — IMO0002 Reserved for concepts with insufficient information to code with codable children: Principal | ICD-10-CM | POA: Insufficient documentation

## 2012-04-23 DIAGNOSIS — R262 Difficulty in walking, not elsewhere classified: Secondary | ICD-10-CM | POA: Insufficient documentation

## 2012-04-23 DIAGNOSIS — Z01812 Encounter for preprocedural laboratory examination: Secondary | ICD-10-CM | POA: Insufficient documentation

## 2012-04-23 DIAGNOSIS — F4310 Post-traumatic stress disorder, unspecified: Secondary | ICD-10-CM

## 2012-04-23 HISTORY — PX: ORIF ANKLE FRACTURE: SHX5408

## 2012-04-23 SURGERY — OPEN REDUCTION INTERNAL FIXATION (ORIF) ANKLE FRACTURE
Anesthesia: General | Site: Ankle | Laterality: Right | Wound class: Clean

## 2012-04-23 MED ORDER — VITAMIN B-1 100 MG PO TABS
100.0000 mg | ORAL_TABLET | Freq: Every day | ORAL | Status: DC
  Administered 2012-04-23 – 2012-04-24 (×2): 100 mg via ORAL
  Filled 2012-04-23 (×2): qty 1

## 2012-04-23 MED ORDER — OXYCODONE-ACETAMINOPHEN 5-325 MG PO TABS
1.0000 | ORAL_TABLET | ORAL | Status: DC | PRN
  Administered 2012-04-23 – 2012-04-24 (×6): 2 via ORAL
  Filled 2012-04-23 (×6): qty 2

## 2012-04-23 MED ORDER — ONDANSETRON HCL 4 MG PO TABS: 4.0000 mg | ORAL_TABLET | Freq: Four times a day (QID) | ORAL | Status: DC | PRN

## 2012-04-23 MED ORDER — FENTANYL CITRATE 0.05 MG/ML IJ SOLN
INTRAMUSCULAR | Status: DC | PRN
  Administered 2012-04-23 (×3): 50 ug via INTRAVENOUS
  Administered 2012-04-23: 100 ug via INTRAVENOUS

## 2012-04-23 MED ORDER — METOCLOPRAMIDE HCL 5 MG/ML IJ SOLN: 5.0000 mg | Freq: Three times a day (TID) | INTRAMUSCULAR | Status: DC | PRN

## 2012-04-23 MED ORDER — MEPERIDINE HCL 25 MG/ML IJ SOLN: 6.2500 mg | INTRAMUSCULAR | Status: DC | PRN

## 2012-04-23 MED ORDER — BUPIVACAINE HCL (PF) 0.75 % IJ SOLN
INTRAMUSCULAR | Status: DC | PRN
  Administered 2012-04-23: 15 mg via INTRATHECAL

## 2012-04-23 MED ORDER — GABAPENTIN 300 MG PO CAPS
300.0000 mg | ORAL_CAPSULE | Freq: Two times a day (BID) | ORAL | Status: DC
  Administered 2012-04-23 – 2012-04-24 (×2): 300 mg via ORAL
  Filled 2012-04-23 (×3): qty 1

## 2012-04-23 MED ORDER — LORAZEPAM 1 MG PO TABS
1.0000 mg | ORAL_TABLET | Freq: Four times a day (QID) | ORAL | Status: DC | PRN
  Administered 2012-04-23 – 2012-04-24 (×3): 1 mg via ORAL
  Filled 2012-04-23 (×3): qty 1

## 2012-04-23 MED ORDER — LACTATED RINGERS IV SOLN
INTRAVENOUS | Status: DC | PRN
  Administered 2012-04-23 (×2): via INTRAVENOUS

## 2012-04-23 MED ORDER — ASPIRIN 325 MG PO TABS
325.0000 mg | ORAL_TABLET | Freq: Every day | ORAL | Status: DC
  Administered 2012-04-23 – 2012-04-24 (×2): 325 mg via ORAL
  Filled 2012-04-23 (×2): qty 1

## 2012-04-23 MED ORDER — POTASSIUM CHLORIDE IN NACL 20-0.9 MEQ/L-% IV SOLN
INTRAVENOUS | Status: DC
  Administered 2012-04-23: 18:00:00 via INTRAVENOUS
  Filled 2012-04-23 (×2): qty 1000

## 2012-04-23 MED ORDER — NAPROXEN 500 MG PO TABS
500.0000 mg | ORAL_TABLET | Freq: Three times a day (TID) | ORAL | Status: DC
  Administered 2012-04-23 – 2012-04-24 (×3): 500 mg via ORAL
  Filled 2012-04-23 (×5): qty 1

## 2012-04-23 MED ORDER — PROPOFOL INFUSION 10 MG/ML OPTIME
INTRAVENOUS | Status: DC | PRN
  Administered 2012-04-23: 120 ug/kg/min via INTRAVENOUS

## 2012-04-23 MED ORDER — NICOTINE 14 MG/24HR TD PT24
14.0000 mg | MEDICATED_PATCH | Freq: Every day | TRANSDERMAL | Status: DC
  Administered 2012-04-23 – 2012-04-24 (×2): 14 mg via TRANSDERMAL
  Filled 2012-04-23 (×2): qty 1

## 2012-04-23 MED ORDER — FENTANYL CITRATE 0.05 MG/ML IJ SOLN: 25.0000 ug | INTRAMUSCULAR | Status: DC | PRN

## 2012-04-23 MED ORDER — MIDAZOLAM HCL 2 MG/2ML IJ SOLN: 0.5000 mg | Freq: Once | INTRAMUSCULAR | Status: DC | PRN

## 2012-04-23 MED ORDER — LORAZEPAM 2 MG/ML IJ SOLN: 1.0000 mg | Freq: Four times a day (QID) | INTRAMUSCULAR | Status: DC | PRN

## 2012-04-23 MED ORDER — CLINDAMYCIN PHOSPHATE 600 MG/50ML IV SOLN: 600.0000 mg | Freq: Once | INTRAVENOUS | Status: DC

## 2012-04-23 MED ORDER — LACTATED RINGERS IV SOLN
INTRAVENOUS | Status: DC
  Administered 2012-04-23: 10:00:00 via INTRAVENOUS

## 2012-04-23 MED ORDER — TIOTROPIUM BROMIDE MONOHYDRATE 18 MCG IN CAPS
18.0000 ug | ORAL_CAPSULE | Freq: Every day | RESPIRATORY_TRACT | Status: DC
  Administered 2012-04-24: 18 ug via RESPIRATORY_TRACT
  Filled 2012-04-23: qty 5

## 2012-04-23 MED ORDER — MIDAZOLAM HCL 5 MG/5ML IJ SOLN
INTRAMUSCULAR | Status: DC | PRN
  Administered 2012-04-23: 2 mg via INTRAVENOUS

## 2012-04-23 MED ORDER — METHOCARBAMOL 100 MG/ML IJ SOLN
500.0000 mg | Freq: Four times a day (QID) | INTRAMUSCULAR | Status: DC | PRN
  Filled 2012-04-23: qty 5

## 2012-04-23 MED ORDER — FOLIC ACID 1 MG PO TABS
1.0000 mg | ORAL_TABLET | Freq: Every day | ORAL | Status: DC
  Administered 2012-04-23 – 2012-04-24 (×2): 1 mg via ORAL
  Filled 2012-04-23 (×2): qty 1

## 2012-04-23 MED ORDER — MORPHINE SULFATE 2 MG/ML IJ SOLN
1.0000 mg | INTRAMUSCULAR | Status: DC | PRN
  Administered 2012-04-23 – 2012-04-24 (×7): 1 mg via INTRAVENOUS
  Filled 2012-04-23 (×8): qty 1

## 2012-04-23 MED ORDER — METHOCARBAMOL 500 MG PO TABS
500.0000 mg | ORAL_TABLET | Freq: Four times a day (QID) | ORAL | Status: DC | PRN
  Administered 2012-04-23 – 2012-04-24 (×3): 500 mg via ORAL
  Filled 2012-04-23 (×3): qty 1

## 2012-04-23 MED ORDER — PROMETHAZINE HCL 25 MG/ML IJ SOLN: 6.2500 mg | INTRAMUSCULAR | Status: DC | PRN

## 2012-04-23 MED ORDER — METOCLOPRAMIDE HCL 10 MG PO TABS: 5.0000 mg | ORAL_TABLET | Freq: Three times a day (TID) | ORAL | Status: DC | PRN

## 2012-04-23 MED ORDER — THIAMINE HCL 100 MG/ML IJ SOLN
100.0000 mg | Freq: Every day | INTRAMUSCULAR | Status: DC
  Filled 2012-04-23 (×2): qty 1

## 2012-04-23 MED ORDER — LIDOCAINE HCL (CARDIAC) 20 MG/ML IV SOLN
INTRAVENOUS | Status: DC | PRN
  Administered 2012-04-23: 100 mg via INTRAVENOUS

## 2012-04-23 MED ORDER — CLINDAMYCIN PHOSPHATE 600 MG/50ML IV SOLN
600.0000 mg | Freq: Three times a day (TID) | INTRAVENOUS | Status: AC
  Administered 2012-04-23 – 2012-04-24 (×2): 600 mg via INTRAVENOUS
  Filled 2012-04-23 (×2): qty 50

## 2012-04-23 MED ORDER — EPHEDRINE SULFATE 50 MG/ML IJ SOLN
INTRAMUSCULAR | Status: DC | PRN
  Administered 2012-04-23: 5 mg via INTRAVENOUS
  Administered 2012-04-23: 10 mg via INTRAVENOUS
  Administered 2012-04-23 (×2): 5 mg via INTRAVENOUS

## 2012-04-23 MED ORDER — ONDANSETRON HCL 4 MG/2ML IJ SOLN: 4.0000 mg | Freq: Four times a day (QID) | INTRAMUSCULAR | Status: DC | PRN

## 2012-04-23 MED ORDER — 0.9 % SODIUM CHLORIDE (POUR BTL) OPTIME
TOPICAL | Status: DC | PRN
  Administered 2012-04-23: 1000 mL

## 2012-04-23 MED ORDER — ADULT MULTIVITAMIN W/MINERALS CH
1.0000 | ORAL_TABLET | Freq: Every day | ORAL | Status: DC
  Administered 2012-04-23 – 2012-04-24 (×2): 1 via ORAL
  Filled 2012-04-23 (×2): qty 1

## 2012-04-23 MED ORDER — CLINDAMYCIN PHOSPHATE 600 MG/50ML IV SOLN
INTRAVENOUS | Status: AC
  Administered 2012-04-23: 600 mg via INTRAVENOUS
  Filled 2012-04-23: qty 50

## 2012-04-23 MED ORDER — ALBUTEROL SULFATE (5 MG/ML) 0.5% IN NEBU: 2.5000 mg | INHALATION_SOLUTION | RESPIRATORY_TRACT | Status: DC | PRN

## 2012-04-23 SURGICAL SUPPLY — 73 items
BANDAGE ELASTIC 4 VELCRO ST LF (GAUZE/BANDAGES/DRESSINGS) ×2 IMPLANT
BANDAGE ELASTIC 6 VELCRO ST LF (GAUZE/BANDAGES/DRESSINGS) IMPLANT
BANDAGE GAUZE ELAST BULKY 4 IN (GAUZE/BANDAGES/DRESSINGS) ×2 IMPLANT
BIT DRILL QC 2.7 6.3IN  SHORT (BIT) ×1
BIT DRILL QC 2.7 6.3IN SHORT (BIT) ×1 IMPLANT
BLADE SURG 10 STRL SS (BLADE) IMPLANT
BLADE SURG 15 STRL LF DISP TIS (BLADE) ×1 IMPLANT
BLADE SURG 15 STRL SS (BLADE) ×1
BNDG COHESIVE 6X5 TAN STRL LF (GAUZE/BANDAGES/DRESSINGS) ×2 IMPLANT
BNDG ESMARK 4X9 LF (GAUZE/BANDAGES/DRESSINGS) ×2 IMPLANT
BONE CHIP PRESERV 20CC (Bone Implant) ×2 IMPLANT
CHLORAPREP W/TINT 10.5 ML (MISCELLANEOUS) ×2 IMPLANT
CLOTH BEACON ORANGE TIMEOUT ST (SAFETY) ×2 IMPLANT
COVER MAYO STAND STRL (DRAPES) ×4 IMPLANT
COVER SURGICAL LIGHT HANDLE (MISCELLANEOUS) ×2 IMPLANT
CUFF TOURNIQUET SINGLE 34IN LL (TOURNIQUET CUFF) IMPLANT
CUFF TOURNIQUET SINGLE 44IN (TOURNIQUET CUFF) IMPLANT
DRAPE C-ARM 42X72 X-RAY (DRAPES) ×2 IMPLANT
DRAPE INCISE IOBAN 66X45 STRL (DRAPES) IMPLANT
DRAPE SURG 17X23 STRL (DRAPES) ×2 IMPLANT
DRAPE U-SHAPE 47X51 STRL (DRAPES) ×2 IMPLANT
DRSG PAD ABDOMINAL 8X10 ST (GAUZE/BANDAGES/DRESSINGS) ×2 IMPLANT
DURAPREP 26ML APPLICATOR (WOUND CARE) ×2 IMPLANT
ELECT REM PT RETURN 9FT ADLT (ELECTROSURGICAL) ×2
ELECTRODE REM PT RTRN 9FT ADLT (ELECTROSURGICAL) ×1 IMPLANT
FACESHIELD LNG OPTICON STERILE (SAFETY) IMPLANT
GAUZE XEROFORM 5X9 LF (GAUZE/BANDAGES/DRESSINGS) ×2 IMPLANT
GLOVE BIO SURGEON STRL SZ7.5 (GLOVE) ×2 IMPLANT
GLOVE BIOGEL PI IND STRL 7.0 (GLOVE) ×1 IMPLANT
GLOVE BIOGEL PI IND STRL 8 (GLOVE) ×1 IMPLANT
GLOVE BIOGEL PI IND STRL 9 (GLOVE) ×1 IMPLANT
GLOVE BIOGEL PI INDICATOR 7.0 (GLOVE) ×1
GLOVE BIOGEL PI INDICATOR 8 (GLOVE) ×1
GLOVE BIOGEL PI INDICATOR 9 (GLOVE) ×1
GLOVE SURG ORTHO 8.0 STRL STRW (GLOVE) ×2 IMPLANT
GLOVE SURG SS PI 6.5 STRL IVOR (GLOVE) ×2 IMPLANT
GOWN PREVENTION PLUS LG XLONG (DISPOSABLE) IMPLANT
GOWN STRL NON-REIN LRG LVL3 (GOWN DISPOSABLE) ×6 IMPLANT
HANDPIECE INTERPULSE COAX TIP (DISPOSABLE)
KIT BASIN OR (CUSTOM PROCEDURE TRAY) ×2 IMPLANT
KIT ROOM TURNOVER OR (KITS) ×2 IMPLANT
MANIFOLD NEPTUNE II (INSTRUMENTS) ×2 IMPLANT
NEEDLE HYPO 25GX1X1/2 BEV (NEEDLE) ×2 IMPLANT
NS IRRIG 1000ML POUR BTL (IV SOLUTION) ×2 IMPLANT
PACK ORTHO EXTREMITY (CUSTOM PROCEDURE TRAY) ×2 IMPLANT
PAD ARMBOARD 7.5X6 YLW CONV (MISCELLANEOUS) ×4 IMPLANT
PAD CAST 4YDX4 CTTN HI CHSV (CAST SUPPLIES) ×1 IMPLANT
PADDING CAST COTTON 4X4 STRL (CAST SUPPLIES) ×1
PLATE FIBULA DIST 4H (Plate) ×2 IMPLANT
SCREW CANC 5.0X14 (Screw) ×2 IMPLANT
SCREW LOCK 10X3.5XST NS (Screw) ×1 IMPLANT
SCREW LOCK 3.5X10 (Screw) ×1 IMPLANT
SCREW LOCK 3.5X8 (Screw) ×4 IMPLANT
SCREW NL 3.5X14 (Screw) ×4 IMPLANT
SCREW NLCK 16X3.5XST CORT PRLC (Screw) ×1 IMPLANT
SCREW NON LOCK 3.5X12 (Screw) ×2 IMPLANT
SCREW NONLOCK 3.5X16 (Screw) ×1 IMPLANT
SET HNDPC FAN SPRY TIP SCT (DISPOSABLE) IMPLANT
SPONGE GAUZE 4X4 12PLY (GAUZE/BANDAGES/DRESSINGS) ×2 IMPLANT
SPONGE LAP 18X18 X RAY DECT (DISPOSABLE) ×2 IMPLANT
STOCKINETTE IMPERVIOUS 9X36 MD (GAUZE/BANDAGES/DRESSINGS) IMPLANT
STOCKINETTE IMPERVIOUS LG (DRAPES) ×2 IMPLANT
SUCTION FRAZIER TIP 10 FR DISP (SUCTIONS) ×2 IMPLANT
SUT ETHILON 3 0 PS 1 (SUTURE) ×4 IMPLANT
SUT VIC AB 2-0 CTB1 (SUTURE) ×2 IMPLANT
SUT VIC AB 3-0 SH 27 (SUTURE) ×1
SUT VIC AB 3-0 SH 27X BRD (SUTURE) ×1 IMPLANT
SYR CONTROL 10ML LL (SYRINGE) ×2 IMPLANT
TOWEL OR 17X24 6PK STRL BLUE (TOWEL DISPOSABLE) ×2 IMPLANT
TOWEL OR 17X26 10 PK STRL BLUE (TOWEL DISPOSABLE) ×2 IMPLANT
TUBE CONNECTING 12X1/4 (SUCTIONS) ×2 IMPLANT
WATER STERILE IRR 1000ML POUR (IV SOLUTION) IMPLANT
YANKAUER SUCT BULB TIP NO VENT (SUCTIONS) IMPLANT

## 2012-04-23 NOTE — H&P (Signed)
Rebekah Murillo is an 47 y.o. female.   Chief Complaint: Right ankle pain HPI: Rebekah Murillo is a 47 year old female with right ankle fracture. She sustained this fracture months ago. She had a child nonoperative therapy to allow for healing however she was by her admission noncooperative with the recommended treatment plan. This included weightbearing AGAINST MEDICAL ADVICE and continuing to smoke. 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 Patient is continued pain on lateral side of the ankle. She presents now for operative management after expiration risk and benefits. She does state that she has decreased her smoking.  Past Medical History  Diagnosis Date  . Major depression   . PTSD (post-traumatic stress disorder)   . Asthma   . Eczema     Past Surgical History  Procedure Laterality Date  . Appendectomy    . Bunionectomy      bilateral    No family history on file. Social History:  reports that she has been smoking.  She does not have any smokeless tobacco history on file. She reports that she drinks about 1.8 ounces of alcohol per week. She reports that she does not use illicit drugs.  Allergies:  Allergies  Allergen Reactions  . Codeine Hives  . Penicillins Hives and Nausea And Vomiting  . Tramadol     Seizure, does not like the way it makes her feel    Medications Prior to Admission  Medication Sig Dispense Refill  . gabapentin (NEURONTIN) 300 MG capsule Take 300 mg by mouth 2 (two) times daily.      . naproxen (NAPROSYN) 500 MG tablet Take 1 tablet (500 mg total) by mouth 3 (three) times daily with meals.  30 tablet  0  . traMADol (ULTRAM) 50 MG tablet Take 100 mg by mouth every 6 (six) hours as needed for pain.        No results found for this or any previous visit (from the past 48 hour(s)). No results found.  Review of Systems  Constitutional: Negative.   HENT: Negative.   Eyes: Negative.   Respiratory: Negative.   Cardiovascular:  Negative.   Gastrointestinal: Negative.   Genitourinary: Negative.   Musculoskeletal: Positive for joint pain.  Skin: Negative.   Neurological: Negative.   Endo/Heme/Allergies: Negative.   Psychiatric/Behavioral: Negative.     Blood pressure 126/82, pulse 91, temperature 98.5 F (36.9 C), temperature source Oral, resp. rate 18, SpO2 97.00%. Physical Exam  Constitutional: She appears well-developed.  HENT:  Head: Normocephalic.  Eyes: Pupils are equal, round, and reactive to light.  Neck: Normal range of motion.  Cardiovascular: Normal rate.   Respiratory: Effort normal.  GI: Soft.   right ankle demonstrates trace palpable pedal pulses and sensation dorsum plantar aspect of the foot on the right-hand side palpable intake nontender and tissue Peschke peroneal Achilles tendon is just a tenderness over the distal fibula ankle dorsiflexion plantar flexion is intact syndesmosis is stable  Assessment/Plan Impression is right distal fibular fracture nonunion despite extensive attempts at healing in a patient who has demonstrated by her admission uncooperative behavior in the past. The hands are really tied with this case because she has  decreased smoking some but her nonunion persist as documented by CT scan. She is symptomatic in this area. Operative plan is for distal tibia bone graft and plate fixation of the fracture. I did discuss with her extensively the risk and benefits of the procedure including but not limited to infection persistent pain persistent nonunion. Also discussed  with her because we had issues in the past with pain medicine usage the fact that she'll have a limited amount of pain medicine postoperatively. Patient understands the risk and benefits of surgical intervention and wishes to proceed because of persistent pain in the right ankle and documented nonunion in this area. She does be a biological boost which is why I plan to use autologous distal tibia bone graft. Patient  understands wishes to proceed all questions answered  Rebekah Murillo 04/23/2012, 10:38 AM

## 2012-04-23 NOTE — Transfer of Care (Signed)
Immediate Anesthesia Transfer of Care Note  Patient: Rebekah Murillo  Procedure(s) Performed: Procedure(s) with comments: Right ankle fracture/non-union takedown, grafting, plating (Right) - Right ankle fracture non-union takedown, grafting, plating   Patient Location: PACU  Anesthesia Type:MAC and Regional  Level of Consciousness: awake, alert  and oriented  Airway & Oxygen Therapy: Patient Spontanous Breathing and Patient connected to nasal cannula oxygen  Post-op Assessment: Report given to PACU RN and Post -op Vital signs reviewed and stable  Post vital signs: Reviewed and stable  Complications: No apparent anesthesia complications

## 2012-04-23 NOTE — Preoperative (Signed)
Beta Blockers   Reason not to administer Beta Blockers:Not Applicable 

## 2012-04-23 NOTE — Progress Notes (Signed)
Orthopedic Tech Progress Note Patient Details:  Rebekah Murillo Jun 01, 1965 [id number]  Ortho Devices Type of Ortho Device: CAM walker Ortho Device/Splint Location: (R) LE Ortho Device/Splint Interventions: Application;Ordered   [name] 04/23/2012, 1:42 PM Cam walker order by Dr. August Saucer.

## 2012-04-23 NOTE — Progress Notes (Signed)
Utilization review complete. Cathie Bonnell RN CCM Case Mgmt phone [phone] 

## 2012-04-23 NOTE — Brief Op Note (Signed)
04/23/2012  1:18 PM  PATIENT:  Rebekah Murillo  47 y.o. female  PRE-OPERATIVE DIAGNOSIS:  Right Ankle Fracture, nonunion  POST-OPERATIVE DIAGNOSIS:  Right Ankle Fracture, nonunion  PROCEDURE:  Procedure(s): Right ankle fracture/non-union takedown, grafting, plating  SURGEON:  Surgeon(s): Cammy Copa, MD  ASSISTANT: none  ANESTHESIA:   general  EBL: 15 ml    Total I/O In: 1300 [I.V.:1300] Out: 50 [Blood:50]  BLOOD ADMINISTERED: none  DRAINS: none   LOCAL MEDICATIONS USED:  none  SPECIMEN:  No Specimen  COUNTS:  YES  TOURNIQUET:    DICTATION: .Other Dictation: Dictation Number [id number]  PLAN OF CARE: Admit for overnight observation  PATIENT DISPOSITION:  PACU - hemodynamically stable

## 2012-04-23 NOTE — Anesthesia Preprocedure Evaluation (Addendum)
Anesthesia Evaluation  Patient identified by MRN, date of birth, ID band Patient awake    Reviewed: Allergy & Precautions, H&P , Patient's Chart, lab work & pertinent test results, reviewed documented beta blocker date and time   History of Anesthesia Complications Negative for: history of anesthetic complications  Airway Mallampati: II TM Distance: >3 FB Neck ROM: full    Dental no notable dental hx.    Pulmonary neg pulmonary ROS, asthma ,  breath sounds clear to auscultation  Pulmonary exam normal       Cardiovascular Exercise Tolerance: Good negative cardio ROS  Rhythm:regular Rate:Normal     Neuro/Psych negative neurological ROS  negative psych ROS   GI/Hepatic negative GI ROS, Neg liver ROS,   Endo/Other  negative endocrine ROS  Renal/GU negative Renal ROS     Musculoskeletal   Abdominal   Peds  Hematology negative hematology ROS (+)   Anesthesia Other Findings Major depression     PTSD (post-traumatic stress disorder)        Asthma     Eczema    Reproductive/Obstetrics negative OB ROS                          Anesthesia Physical Anesthesia Plan  ASA: II  Anesthesia Plan: Spinal   Post-op Pain Management:    Induction:   Airway Management Planned:   Additional Equipment:   Intra-op Plan:   Post-operative Plan:   Informed Consent: I have reviewed the patients History and Physical, chart, labs and discussed the procedure including the risks, benefits and alternatives for the proposed anesthesia with the patient or authorized representative who has indicated his/her understanding and acceptance.   Dental Advisory Given  Plan Discussed with: CRNA and Surgeon  Anesthesia Plan Comments:       Anesthesia Quick Evaluation

## 2012-04-23 NOTE — Anesthesia Procedure Notes (Signed)
Spinal  Patient location during procedure: OR Start time: 04/23/2012 11:00 AM Staffing Anesthesiologist: Angus Seller., Harrell Gave. Performed by: anesthesiologist  Preanesthetic Checklist Completed: patient identified, site marked, surgical consent, pre-op evaluation, timeout performed, IV checked, risks and benefits discussed and monitors and equipment checked Spinal Block Patient position: sitting Prep: DuraPrep Patient monitoring: heart rate, cardiac monitor, continuous pulse ox and blood pressure Approach: midline Location: L3-4 Injection technique: single-shot Needle Needle type: Sprotte  Needle gauge: 25 G Needle length: 9 cm Assessment Sensory level: T4 Additional Notes Patient identified.  Risk benefits discussed including failed block, incomplete pain control, headache, nerve damage, paralysis, blood pressure changes, nausea, vomiting, reactions to medication both toxic or allergic, and postpartum back pain.  Patient expressed understanding and wished to proceed.  All questions were answered.  Sterile technique used throughout procedure.  CSF was clear.  No parasthesia or other complications.  Please see nursing notes for vital signs.

## 2012-04-23 NOTE — Consult Note (Signed)
[facility] Medical Consultation  Rebekah Murillo JWJ:[id number] DOB: Aug 28, 1965 DOA: 04/23/2012 PCP: No primary provider on file.   Requesting physician: Dr Dorene Grebe, orthopedic surgeon. .  Date of consultation: 04/23/12.  Reason for consultation: Abnormal CXR findings.   Impression/Recommendations Active Problems:   1. Chronic obstructive airway disease with asthma: Patient has known COPD, but does not appear to be in overt exacerbation at this time, although chest auscultation reveals expiratory rhonchi. Will commence spiriva Handi-haler, as well as prn Albuterol. Patient will need Albuterol MDI on discharge.  2. Tobacco abuse: Patient unfortunately, smokes a pack-a pack and half cigarettes per day, although she has tried to cut down lately. Have counseled appropriately. Will commence Nicoderm CQ patch.  3. PTSD (post-traumatic stress disorder)/Depression: Stable at this time.  4. Abnormal CXR (chest x-ray): CXR of 04/19/12, revealed likely small granuloma in the right base. Given patient's smoking history, follow up imaging is in order and would ordinarily, be done on an outpatient basis. Patient however, has no PMD. Will arrange chest CT scan to evaluate this abnormality in detail. Further management if indicated, will depend on findings.  5. ETOH use: Patient apparently consumes about 1.8 oz ETOH per week. Will need to watch out for withdrawal phenomena. Utilize CIWA.   We will follow up again tomorrow. Please contact me if I can be of assistance in the meanwhile. Thank you for this consultation.  Chief Complaint: Occasional cough, smoker, abnormal CXR.   HPI:  47 year old female with known history of tobacco abuse, bronchial asthma, PTSD, major depression, eczema, s/p previous appendectomy and bilateral bunionectomies, admitted 04/23/12, for a right ankle fracture sustained several months ago, and complicated by non-union. Patient is s/p right ankle fracture/non-union takedown,  grafting, plating today. CXR on 04/19/12, revealed likely small granuloma in the right base. Dr August Saucer is concerned about this, and has requested medical consultation to assist in evaluation. Patient has occasional cough, is a smoker, but has no SOB, fever or chills.     Review of Systems:  As per HPI and chief complaint. Rebekah Murillo denies fatigue, diminished appetite, weight loss, fever, chills, headache, blurred vision, difficulty in speaking, dysphagia, chest pain, cough, has occasional SOB, but no orthopnea, paroxysmal nocturnal dyspnea, nausea, diaphoresis, abdominal pain, vomiting, diarrhea, belching, heartburn, hematemesis, melena, dysuria, nocturia, urinary frequency, hematochezia, LLE swelling or pain. The rest of the systems review is negative.   Past Medical History  Diagnosis Date  . Major depression   . PTSD (post-traumatic stress disorder)   . Asthma   . Eczema    Past Surgical History  Procedure Laterality Date  . Appendectomy    . Bunionectomy      bilateral   Social History:  Patient reports that she has been smoking.  She does not have any smokeless tobacco history on file. She reports that she drinks about 1.8 ounces of alcohol per week. She reports that she does not use illicit drugs.  Allergies  Allergen Reactions  . Codeine Hives  . Penicillins Hives and Nausea And Vomiting  . Tramadol     Seizure, does not like the way it makes her feel   No family history on file.  Prior to Admission medications   Medication Sig Start Date End Date Taking? Authorizing Provider  gabapentin (NEURONTIN) 300 MG capsule Take 300 mg by mouth 2 (two) times daily.   Yes Historical Provider, MD  naproxen (NAPROSYN) 500 MG tablet Take 1 tablet (500 mg total) by mouth 3 (three) times daily with  meals. 03/19/12  Yes Nishant Dhungel, MD  traMADol (ULTRAM) 50 MG tablet Take 100 mg by mouth every 6 (six) hours as needed for pain.   Yes Historical Provider, MD   Physical Exam: Blood pressure  133/80, pulse 73, temperature 97.5 F (36.4 C), temperature source Oral, resp. rate 16, SpO2 100.00%. Filed Vitals:   04/23/12 1320 04/23/12 1415 04/23/12 1430 04/23/12 1450  BP: 103/59 123/56 121/58 133/80  Pulse: 71 56 65 73  Temp: 97.6 F (36.4 C)  98.2 F (36.8 C) 97.5 F (36.4 C)  TempSrc:    Oral  Resp: 13 18 15 16   SpO2: 100% 100% 100% 100%    General: Comfortable, alert, communicative, fully oriented, not short of breath at rest.  HEENT:  No clinical pallor, no jaundice, no conjunctival injection or discharge. Hydration appears fair.  NECK:  Supple, JVP not seen, no carotid bruits, no palpable lymphadenopathy, no palpable goiter. CHEST:  Has bilateral expiratory rhonchi, but no crackles. HEART:  Sounds 1 and 2 heard, normal, regular, no murmurs. ABDOMEN:  Full, soft, non-tender, no palpable organomegaly, no palpable masses, normal bowel sounds. GENITALIA:  Not examined. LOWER EXTREMITIES:  RLE in immobilizer. LLE no pitting edema, palpable peripheral pulses. MUSCULOSKELETAL SYSTEM:  Generalized osteoarthritic changes, otherwise, normal. CENTRAL NERVOUS SYSTEM:  No focal neurologic deficit on gross examination.   Labs on Admission:  Basic Metabolic Panel: No results found for this basename: NA, K, CL, CO2, GLUCOSE, BUN, CREATININE, CALCIUM, MG, PHOS,  in the last 168 hours Liver Function Tests: No results found for this basename: AST, ALT, ALKPHOS, BILITOT, PROT, ALBUMIN,  in the last 168 hours No results found for this basename: LIPASE, AMYLASE,  in the last 168 hours No results found for this basename: AMMONIA,  in the last 168 hours CBC:  Recent Labs Lab 04/19/12 1408  WBC 12.0*  HGB 14.1  HCT 41.0  MCV 90.1  PLT 465*   Cardiac Enzymes: No results found for this basename: CKTOTAL, CKMB, CKMBINDEX, TROPONINI,  in the last 168 hours BNP: No components found with this basename: POCBNP,  CBG: No results found for this basename: GLUCAP,  in the last 168  hours  Radiological Exams on Admission: Dg Ankle 2 Views Right  04/23/2012  *RADIOLOGY REPORT*  Clinical Data: Nonunion of the ankle fracture.  DG C-ARM 1-60 MIN,RIGHT ANKLE - 2 VIEW  Technique: Single intraoperative fluoroscopic spot film in the frontal projection.  Comparison:  CT 03/27/2012.  Findings: Single frontal fluoroscopic spot film demonstrates placement of the lateral malleolar plate and screw fixation apparatus across the transverse fibular/lateral malleolar fracture.  IMPRESSION: ORIF of the right lateral malleolar fracture.   Original Report Authenticated By: Andreas Newport, M.D.    Dg Ankle Right Port  04/23/2012  *RADIOLOGY REPORT*  Clinical Data: Postop ankle fracture, ORIF  PORTABLE RIGHT ANKLE - 2 VIEW  Comparison: 04/23/2012, 03/27/2012  Findings: Plate screw fixation has been performed of the right ankle lateral malleolar fracture.  Anatomic alignment.  Lucency in the distal tibia is presumed from a bone harvest site.  No acute osseous finding or hardware abnormality.  IMPRESSION: Expected appearance status post right ankle lateral malleolar ORIF. Anatomic alignment.  No complicating feature.   Original Report Authenticated By: Judie Petit. Miles Costain, M.D.    Dg C-arm 1-60 Min  04/23/2012  *RADIOLOGY REPORT*  Clinical Data: Nonunion of the ankle fracture.  DG C-ARM 1-60 MIN,RIGHT ANKLE - 2 VIEW  Technique: Single intraoperative fluoroscopic spot film in the frontal projection.  Comparison:  CT 03/27/2012.  Findings: Single frontal fluoroscopic spot film demonstrates placement of the lateral malleolar plate and screw fixation apparatus across the transverse fibular/lateral malleolar fracture.  IMPRESSION: ORIF of the right lateral malleolar fracture.   Original Report Authenticated By: Andreas Newport, M.D.     EKG: N/A.   Time spent: 45 mins.  Zeda Gangwer,CHRISTOPHER [facility] Pager [phone]  If 7PM-7AM, please contact night-coverage [email or web address] Password [facility] 04/23/2012, 4:06  PM

## 2012-04-23 NOTE — Anesthesia Postprocedure Evaluation (Signed)
  Anesthesia Post-op Note  Patient: Rebekah Murillo  Procedure(s) Performed: Procedure(s) with comments: Right ankle fracture/non-union takedown, grafting, plating (Right) - Right ankle fracture non-union takedown, grafting, plating   Patient Location: PACU  Anesthesia Type:Spinal  Level of Consciousness: awake, alert  and oriented  Airway and Oxygen Therapy: Patient Spontanous Breathing  Post-op Pain: none  Post-op Assessment: Post-op Vital signs reviewed, Patient's Cardiovascular Status Stable and Respiratory Function Stable  Post-op Vital Signs: Reviewed and stable  Complications: No apparent anesthesia complications

## 2012-04-24 ENCOUNTER — Encounter (HOSPITAL_COMMUNITY): Payer: Self-pay | Admitting: Orthopedic Surgery

## 2012-04-24 MED ORDER — AZITHROMYCIN 500 MG PO TABS
500.0000 mg | ORAL_TABLET | Freq: Every day | ORAL | Status: AC
  Administered 2012-04-24: 500 mg via ORAL
  Filled 2012-04-24: qty 1

## 2012-04-24 MED ORDER — ALBUTEROL SULFATE HFA 108 (90 BASE) MCG/ACT IN AERS
1.0000 | INHALATION_SPRAY | Freq: Four times a day (QID) | RESPIRATORY_TRACT | Status: DC | PRN
  Filled 2012-04-24: qty 6.7

## 2012-04-24 MED ORDER — AZITHROMYCIN 250 MG PO TABS: 250.0000 mg | ORAL_TABLET | Freq: Every day | ORAL | Status: DC

## 2012-04-24 MED ORDER — OXYCODONE-ACETAMINOPHEN 10-325 MG PO TABS: 1.0000 | ORAL_TABLET | ORAL | Status: DC | PRN

## 2012-04-24 MED ORDER — ASPIRIN EC 325 MG PO TBEC: 325.0000 mg | DELAYED_RELEASE_TABLET | Freq: Every day | ORAL | Status: DC

## 2012-04-24 MED ORDER — METHOCARBAMOL 500 MG PO TABS: 500.0000 mg | ORAL_TABLET | Freq: Four times a day (QID) | ORAL | Status: DC | PRN

## 2012-04-24 NOTE — Op Note (Signed)
Rebekah Murillo, BOEDING NO.:  [id number]  MEDICAL RECORD NO.:  [id number]  LOCATION:  [id number]                        FACILITY:  [facility]  PHYSICIAN:  Burnard Bunting, M.D.    DATE OF BIRTH:  [birth year]  DATE OF PROCEDURE: DATE OF DISCHARGE:                              OPERATIVE REPORT   PREOPERATIVE DIAGNOSIS:  Right ankle lateral malleolus fracture nonunion.  POSTOPERATIVE DIAGNOSIS:  Right ankle lateral malleolus fracture nonunion.  PROCEDURE:  Right ankle fracture nonunion takedown with local distal tibial bone grafting and plating.  SURGEON:  Burnard Bunting, M.D.  ASSISTANT:  None.  ANESTHESIA:  General endotracheal.  ESTIMATED BLOOD LOSS:  Minimal.  Ankle Esmarch utilized for approximately 45 minutes.  INDICATIONS:  [name] is a patient with right ankle fracture, lateral malleolus nonunion who presents for operative management after explanation of risks and benefits.  PROCEDURE IN DETAIL:  The patient was brought to operating room, where general endotracheal anesthesia was induced.  Preoperative antibiotics were administered.  Time-out was called.  Right ankle was prescrubbed with alcohol and Betadine, which was allowed to, prepped with DuraPrep solution, draped in sterile manner.  Ankle Esmarch was utilized. Fluoroscopy was also utilized during the case.  Incision was made over the lateral malleolus and distal fibula.  Skin and subcutaneous tissues sharply divided.  Care was taken to avoid injury to superficial peroneal nerve.  Fracture nonunion site was identified and fibrous tissue was removed.  Bony surfaces were curetted.  Drill bit was placed anterior to posterior in order to further open up blood channels in the 2 bones.  At this time, attention was directed towards the medial aspect of the tibia.  A trephine was utilized to obtain local bone graft.  This trephine measured approximately 7 mm.  Bone graft was obtained and supplemented  with cancellous chips.  Thorough irrigation was performed prior to bone grafting.  Bone grafting was then placed into the cylindrical channel in the nonunion site between the bones where the bones had been scraped.  A Smith and Nephew lateral malleolar plate was then placed.  Good fixation achieved and compression achieved.  At this time, ankle Esmarch was released.  Bleeding points were controlled using electrocautery.  Closure was achieved on the medial side using 3-0 Vicryl, 3-0 nylon on the lateral side.  Soft tissue closed over the plate using 2-0 Vicryl followed by interrupted inverted 3-0 Vicryl and 3- 0 nylon.  Bulky dressing was applied.  PLAN:  Plan is for weightbearing as tolerated and fracture boot with ankle range of motion, discharge tomorrow after 23-hour observation on oral pain medicine and aspirin for DVT prophylaxis.     Burnard Bunting, M.D.     GSD/MEDQ  D:  04/23/2012  T:  04/24/2012  Job:  [id number]

## 2012-04-24 NOTE — Evaluation (Signed)
Physical Therapy Evaluation Patient Details Name: Rebekah Murillo MRN: [id number] DOB: 05-26-1965 Today's Date: 04/24/2012 Time: 4098-1191 PT Time Calculation (min): 25 min  PT Assessment / Plan / Recommendation Clinical Impression  Pt is a pleasant 47 y.o. female s/p R lateral malleolus fx on R ankle. Pt limited in therapy by pain; c/o 9/10 with amb. Pt demo decreased safety awareness and is very impulsive; required continuous cues to maintain PWB status on R LE. Pt prefers to be in wheelchair vs amb secondary to pain; required max encouragement to participate in therapy. PT to recommend HHPT and 24/7 care upon acute D/C. DME needs include RW. Will cont to f/u with pt to improve independence with mobility and reduce risk of falls.     PT Assessment  Patient needs continued PT services    Follow Up Recommendations  Home health PT;Supervision/Assistance - 24 hour    Does the patient have the potential to tolerate intense rehabilitation      Barriers to Discharge        Equipment Recommendations  Rolling walker with 5" wheels    Recommendations for Other Services     Frequency Min 5X/week    Precautions / Restrictions Precautions Precautions: Fall Restrictions Weight Bearing Restrictions: Yes RLE Weight Bearing: Partial weight bearing RLE Partial Weight Bearing Percentage or Pounds: 50   Pertinent Vitals/Pain 9/10 with amb; pt premedicated and RN notified. Repositioned.       Mobility  Bed Mobility Bed Mobility: Supine to Sit;Sitting - Scoot to Edge of Bed;Sit to Supine Supine to Sit: 6: Modified independent (Device/Increase time) Sitting - Scoot to Edge of Bed: 6: Modified independent (Device/Increase time) Sit to Supine: 6: Modified independent (Device/Increase time) Details for Bed Mobility Assistance: pt demo good technique with bed mobility; requires increased time secondary to pain in R LE Transfers Transfers: Sit to Stand;Stand to Sit Sit to Stand: 4: Min  guard;With upper extremity assist;From bed Stand to Sit: 4: Min guard;To chair/3-in-1;With upper extremity assist;With armrests Details for Transfer Assistance: Pt very impulsive with transfers; required continuous cues to encourage safe transfers and to maintain PWB on R LE. Pt very impulsive.  Ambulation/Gait Ambulation/Gait Assistance: 4: Min guard Ambulation Distance (Feet): 40 Feet Assistive device: Rolling walker Ambulation/Gait Assistance Details: Pt did not want to use RW; encouraged to do so secondary to PWB status on R LE; pt c/o pain at IV site, limiting her use of RW. Pt required min G to steady; demo decreased safety awareness and is very impulsive with gt.  Gait Pattern: Step-to pattern;Decreased stance time - right;Decreased step length - left;Trunk flexed Gait velocity: decreased Stairs: Yes Stairs Assistance: 4: Min guard Stairs Assistance Details (indicate cue type and reason): required cues for hand placement and sequencing; pt unsteady at first on steps; required bracing; no LOB; pt demo poor safety awareness; continuous cues required to maintain PWB status through R LE. Fiance instructed on proper technique to assist pt; fiance demo understanding of technique.  Stair Management Technique: One rail Right;Sideways Number of Stairs: 2 Wheelchair Mobility Wheelchair Mobility: No    Exercises     PT Diagnosis: Difficulty walking;Acute pain  PT Problem List: Decreased strength;Decreased range of motion;Decreased activity tolerance;Decreased balance;Decreased mobility;Decreased knowledge of use of DME;Decreased safety awareness;Decreased knowledge of precautions;Pain PT Treatment Interventions: DME instruction;Gait training;Stair training;Functional mobility training;Therapeutic activities;Therapeutic exercise;Balance training;Patient/family education   PT Goals Acute Rehab PT Goals PT Goal Formulation: With patient Time For Goal Achievement: 04/24/12 Potential to Achieve  Goals: Good Pt will  go Sit to Stand: with modified independence PT Goal: Sit to Stand - Progress: Goal set today Pt will go Stand to Sit: with modified independence PT Goal: Stand to Sit - Progress: Goal set today Pt will Transfer Bed to Chair/Chair to Bed: with modified independence PT Transfer Goal: Bed to Chair/Chair to Bed - Progress: Goal set today Pt will Ambulate: >150 feet;with modified independence;with least restrictive assistive device PT Goal: Ambulate - Progress: Goal set today Pt will Go Up / Down Stairs: 3-5 stairs;with supervision;with rail(s) PT Goal: Up/Down Stairs - Progress: Goal set today  Visit Information  Last PT Received On: 04/24/12 Assistance Needed: +1    Subjective Data  Subjective: i feel like a hacksaw going through my leg. I just want to be in a wheelchair for a few days. Patient Stated Goal: to go home   Prior Functioning  Home Living Lives With: Other (Comment) (finace) Available Help at Discharge: Available 24 hours/day;Family;Friend(s) Type of Home: Apartment Home Access: Stairs to enter Entrance Stairs-Number of Steps: 3 Entrance Stairs-Rails: Right Home Layout: One level Bathroom Shower/Tub: Engineer, manufacturing systems: Standard Bathroom Accessibility: Yes How Accessible: Accessible via walker Home Adaptive Equipment: Shower chair with back;Wheelchair - Doctor, general practice Prior Function Level of Independence: Independent with assistive device(s) (uses a SPC) Able to Take Stairs?: Yes Driving: No Vocation: On disability Communication Communication: No difficulties Dominant Hand: Left    Cognition  Cognition Overall Cognitive Status: Appears within functional limits for tasks assessed/performed Arousal/Alertness: Awake/alert Orientation Level: Appears intact for tasks assessed Behavior During Session: [facility] for tasks performed    Extremity/Trunk Assessment Right Lower Extremity Assessment RLE ROM/Strength/Tone: Deficits;Unable  to fully assess;Due to pain RLE ROM/Strength/Tone Deficits: quad strength WFL; unable to assess ankle secondary to precautions RLE Sensation: WFL - Light Touch Left Lower Extremity Assessment LLE ROM/Strength/Tone: Within functional levels LLE Sensation: WFL - Light Touch Trunk Assessment Trunk Assessment: Normal   Balance Balance Balance Assessed: No  End of Session PT - End of Session Equipment Utilized During Treatment: Gait belt;Right ankle foot orthosis Activity Tolerance: Patient limited by pain;Other (comment) (c/o 9/10 with amb) Patient left: in bed;with call bell/phone within reach;with family/visitor present Nurse Communication: Mobility status;Patient requests pain meds  GP Functional Assessment Tool Used: clinical judgement  Functional Limitation: Mobility: Walking and moving around Mobility: Walking and Moving Around Current Status [phone]): At least 1 percent but less than 20 percent impaired, limited or restricted Mobility: Walking and Moving Around Goal Status [phone]): 0 percent impaired, limited or restricted   Donell Sievert, [place] [phone] 04/24/2012, 10:33 AM

## 2012-04-24 NOTE — Progress Notes (Signed)
[facility] PROGRESS NOTE  Rebekah Murillo VWU:[id number] DOB: 02-04-1965 DOA: 04/23/2012 PCP: No primary provider on file.  Assessment/Plan: 1. Chronic obstructive airway disease with asthma: Stable. Continue with inhale. Please give prescription for Spiriva and Inhale Albuterol.  2. Tobacco abuse: She will need Nicoderm CQ patch prescription. She is willing to quit smoking.   3. PTSD (post-traumatic stress disorder)/Depression: Stable at this time.  4. Abnormal CXR (chest x-ray): CT chest show ground glass opacities. Atelectasis vs inflammation. Will give short course antibiotics. Patient aware that she needs a follow up CT chest. She will benefit referral to pulmonologist. CM consulted to help arrange follow up with PCP.  5. ETOH use: Monitor on CIWA. No evidence of DT.   Disposition: Discharge per primary team.   Antibiotics:  Azithromycin 4-2  HPI/Subjective: Feeling well, denies SOB.   Objective: Filed Vitals:   04/23/12 1430 04/23/12 1450 04/23/12 2019 04/24/12 0639  BP: 121/58 133/80 146/72 137/69  Pulse: 65 73 81 84  Temp: 98.2 F (36.8 C) 97.5 F (36.4 C) 98.6 F (37 C) 98.3 F (36.8 C)  TempSrc:  Oral Oral Oral  Resp: 15 16 18 17   SpO2: 100% 100% 99% 99%    Intake/Output Summary (Last 24 hours) at 04/24/12 0942 Last data filed at 04/24/12 0839  Gross per 24 hour  Intake   3010 ml  Output     50 ml  Net   2960 ml   There were no vitals filed for this visit.  Exam:   General:  No distress.   Cardiovascular: S 1, S 2 RRR  Respiratory: CTA  Abdomen: Bs present, soft.   Musculoskeletal: right leg with dressing.   Data Reviewed: Basic Metabolic Panel: No results found for this basename: NA, K, CL, CO2, GLUCOSE, BUN, CREATININE, CALCIUM, MG, PHOS,  in the last 168 hours Liver Function Tests: No results found for this basename: AST, ALT, ALKPHOS, BILITOT, PROT, ALBUMIN,  in the last 168 hours No results found for this basename: LIPASE, AMYLASE,   in the last 168 hours No results found for this basename: AMMONIA,  in the last 168 hours CBC:  Recent Labs Lab 04/19/12 1408  WBC 12.0*  HGB 14.1  HCT 41.0  MCV 90.1  PLT 465*   Cardiac Enzymes: No results found for this basename: CKTOTAL, CKMB, CKMBINDEX, TROPONINI,  in the last 168 hours BNP (last 3 results) No results found for this basename: PROBNP,  in the last 8760 hours CBG: No results found for this basename: GLUCAP,  in the last 168 hours  Recent Results (from the past 240 hour(s))  SURGICAL PCR SCREEN     Status: None   Collection Time    04/19/12  2:14 PM      Result Value Range Status   MRSA, PCR NEGATIVE  NEGATIVE Final   Staphylococcus aureus NEGATIVE  NEGATIVE Final   Comment:            The Xpert SA Assay (FDA     approved for NASAL specimens     in patients over 33 years of age),     is one component of     a comprehensive surveillance     program.  Test performance has     been validated by [employer] for patients greater     than or equal to 27 year old.     It is not intended     to diagnose infection nor to  guide or monitor treatment.     Studies: Dg Ankle 2 Views Right  2012/05/07  *RADIOLOGY REPORT*  Clinical Data: Nonunion of the ankle fracture.  DG C-ARM 1-60 MIN,RIGHT ANKLE - 2 VIEW  Technique: Single intraoperative fluoroscopic spot film in the frontal projection.  Comparison:  CT 03/27/2012.  Findings: Single frontal fluoroscopic spot film demonstrates placement of the lateral malleolar plate and screw fixation apparatus across the transverse fibular/lateral malleolar fracture.  IMPRESSION: ORIF of the right lateral malleolar fracture.   Original Report Authenticated By: Andreas Newport, M.D.    Ct Chest Wo Contrast  04/24/2012  *RADIOLOGY REPORT*  Clinical Data: Smoker.  Follow-up nodule on preoperative chest x- ray.  CT CHEST WITHOUT CONTRAST  Technique:  Multidetector CT imaging of the chest was performed following the standard  protocol without IV contrast.  Comparison: Chest x-ray 04/19/2012  Findings: Examination is obtained without IV contrast material due to no IV access.  There are focal areas of linear atelectasis in the lung bases bilaterally.  There are patchy focal nodular and confluent areas of ground-glass opacities distributed throughout the right lung. Given the history recent surgery, this could represent atelectasis or inflammatory process.  Multifocal pneumonia is not excluded. The nodule demonstrated on chest x-ray is not definitively identified although the atelectasis and ground-glass changes could account for the nodule or could obscure the nodule.  A followup study in 3 months is recommended for further evaluation.  Normal heart size.  Normal caliber thoracic aorta with mild calcification.  Small calcified lymph nodes in the mediastinum.  No evidence of significant lymphadenopathy in the chest.  The esophagus is decompressed.  No pneumothorax.  No pleural effusions. Degenerative changes in the thoracic spine.  IMPRESSION: Linear atelectasis in both lung bases.  Patchy focal and confluent areas of ground-glass nodularity scattered throughout the right lung.  These likely represent atelectasis or inflammatory process. 64-month follow-up study is recommended for further evaluation.  The nodule identified on chest radiograph is not specifically identified although it could be caused by or obscured by the other parenchymal process.   Original Report Authenticated By: Burman Nieves, M.D.    Dg Ankle Right Port  05/07/12  *RADIOLOGY REPORT*  Clinical Data: Postop ankle fracture, ORIF  PORTABLE RIGHT ANKLE - 2 VIEW  Comparison: 05/07/12, 03/27/2012  Findings: Plate screw fixation has been performed of the right ankle lateral malleolar fracture.  Anatomic alignment.  Lucency in the distal tibia is presumed from a bone harvest site.  No acute osseous finding or hardware abnormality.  IMPRESSION: Expected appearance status  post right ankle lateral malleolar ORIF. Anatomic alignment.  No complicating feature.   Original Report Authenticated By: Judie Petit. Miles Costain, M.D.    Dg C-arm 1-60 Min  05/07/2012  *RADIOLOGY REPORT*  Clinical Data: Nonunion of the ankle fracture.  DG C-ARM 1-60 MIN,RIGHT ANKLE - 2 VIEW  Technique: Single intraoperative fluoroscopic spot film in the frontal projection.  Comparison:  CT 03/27/2012.  Findings: Single frontal fluoroscopic spot film demonstrates placement of the lateral malleolar plate and screw fixation apparatus across the transverse fibular/lateral malleolar fracture.  IMPRESSION: ORIF of the right lateral malleolar fracture.   Original Report Authenticated By: Andreas Newport, M.D.     Scheduled Meds: . aspirin  325 mg Oral Daily  . folic acid  1 mg Oral Daily  . gabapentin  300 mg Oral BID  . multivitamin with minerals  1 tablet Oral Daily  . naproxen  500 mg Oral TID WC  . nicotine  14 mg Transdermal Daily  . thiamine  100 mg Oral Daily   Or  . thiamine  100 mg Intravenous Daily  . tiotropium  18 mcg Inhalation Daily   Continuous Infusions: . 0.9 % NaCl with KCl 20 mEq / L 75 mL/hr at 04/23/12 1750    Active Problems:   Chronic obstructive airway disease with asthma   Tobacco abuse   PTSD (post-traumatic stress disorder)   Depression   Abnormal CXR (chest x-ray)    Time spent: 25 minutes.     Rebekah Murillo  [facility] Pager [phone]. If 7PM-7AM, please contact night-coverage at [email or web address], password [facility] 04/24/2012, 9:42 AM  LOS: 1 day

## 2012-04-24 NOTE — Progress Notes (Signed)
Pt stable - pain controlled Toes perfused and sensate - did ok with PT Plan dc home

## 2012-04-24 NOTE — Progress Notes (Signed)
Discharge instruction and prescriptions provided.  Patient stated understanding of discharge teaching.

## 2012-05-01 ENCOUNTER — Encounter (HOSPITAL_COMMUNITY): Payer: Self-pay

## 2012-05-01 ENCOUNTER — Ambulatory Visit: Payer: Medicaid Other

## 2012-05-01 ENCOUNTER — Emergency Department (INDEPENDENT_AMBULATORY_CARE_PROVIDER_SITE_OTHER)
Admission: EM | Admit: 2012-05-01 | Discharge: 2012-05-01 | Disposition: A | Discharge status: Home / Self Care | Payer: Medicaid Other | Source: Home / Self Care

## 2012-05-01 DIAGNOSIS — Z720 Tobacco use: Secondary | ICD-10-CM

## 2012-05-01 DIAGNOSIS — M79604 Pain in right leg: Secondary | ICD-10-CM

## 2012-05-01 DIAGNOSIS — F17200 Nicotine dependence, unspecified, uncomplicated: Secondary | ICD-10-CM

## 2012-05-01 DIAGNOSIS — J449 Chronic obstructive pulmonary disease, unspecified: Secondary | ICD-10-CM

## 2012-05-01 NOTE — ED Notes (Signed)
Patient states recently had an  X ray that revealed a shadow on her lung Patient is concerned about that Fracture to right ankle

## 2012-05-01 NOTE — ED Provider Notes (Signed)
History     CSN: [id number]  Arrival date & time 05/01/12  1235   First MD Initiated Contact with Patient 05/01/12 1257      No chief complaint on file.   (Consider location/radiation/quality/duration/timing/severity/associated sxs/prior treatment) HPI Pt is 47 yo female who presents for regular follow up. She denies chest pain or shortness of breath. She explains she has left leg pain, follows with Dr. Dorene Grebe. She denies any specific concerns at this time.   Past Medical History  Diagnosis Date  . Major depression   . PTSD (post-traumatic stress disorder)   . Asthma   . Eczema     Past Surgical History  Procedure Laterality Date  . Appendectomy    . Bunionectomy      bilateral  . Orif ankle fracture Right 04/23/2012    Procedure: Right ankle fracture/non-union takedown, grafting, plating;  Surgeon: Cammy Copa, MD;  Location: [facility];  Service: Orthopedics;  Laterality: Right;  Right ankle fracture non-union takedown, grafting, plating     Family history of HT, HLD  History  Substance Use Topics  . Smoking status: Current Every Day Smoker -- 1.00 packs/day for 31 years  . Smokeless tobacco: Not on file     Comment: Patient reports trying to cut back reports was smoking 2 packs a day  . Alcohol Use: 1.8 oz/week    3 Cans of beer per week     Comment: intermittent    OB History   Grav Para Term Preterm Abortions TAB SAB Ect Mult Living                  Review of Systems Review of Systems  Constitutional: Negative for fever, chills, diaphoresis, activity change, appetite change and fatigue.  HENT: Negative for ear pain, nosebleeds, congestion, facial swelling, rhinorrhea, neck pain, neck stiffness and ear discharge.   Eyes: Negative for pain, discharge, redness, itching and visual disturbance.  Respiratory: Negative for cough, choking, chest tightness, shortness of breath, wheezing and stridor.   Cardiovascular: Negative for chest pain, palpitations and  leg swelling.  Gastrointestinal: Negative for abdominal distention.  Genitourinary: Negative for dysuria, urgency, frequency, hematuria, flank pain, decreased urine volume, difficulty urinating and dyspareunia.  Musculoskeletal: Negative for back pain, joint swelling, arthralgias Neurological: Negative for dizziness, tremors, seizures, syncope, facial asymmetry, speech difficulty, weakness, light-headedness, numbness and headaches.  Hematological: Negative for adenopathy. Does not bruise/bleed easily.  Psychiatric/Behavioral: Negative for hallucinations, behavioral problems, confusion, dysphoric mood, decreased concentration and agitation.    Allergies  Codeine; Penicillins; and Tramadol  Home Medications   Current Outpatient Rx  Name  Route  Sig  Dispense  Refill  . aspirin EC 325 MG tablet   Oral   Take 1 tablet (325 mg total) by mouth daily.   30 tablet   0   . gabapentin (NEURONTIN) 300 MG capsule   Oral   Take 300 mg by mouth 2 (two) times daily.         . methocarbamol (ROBAXIN) 500 MG tablet   Oral   Take 1 tablet (500 mg total) by mouth every 6 (six) hours as needed.   30 tablet   0   . naproxen (NAPROSYN) 500 MG tablet   Oral   Take 1 tablet (500 mg total) by mouth 3 (three) times daily with meals.   30 tablet   0   . oxyCODONE-acetaminophen (PERCOCET) 10-325 MG per tablet   Oral   Take 1 tablet by mouth every  4 (four) hours as needed for pain.   60 tablet   0     BP 132/75  Pulse 94  Temp(Src) 97.7 F (36.5 C) (Oral)  Resp 18  SpO2 98%  Physical Exam Physical Exam  Constitutional: Appears well-developed and well-nourished. No distress.  HENT: Normocephalic. External right and left ear normal. Oropharynx is clear and moist.  Eyes: Conjunctivae and EOM are normal. PERRLA, no scleral icterus.  Neck: Normal ROM. Neck supple. No JVD. No tracheal deviation. No thyromegaly.  CVS: RRR, S1/S2 +, no murmurs, no gallops, no carotid bruit.  Pulmonary:  Effort and breath sounds normal, no stridor, rhonchi, wheezes, rales.  Abdominal: Soft. BS +,  no distension, tenderness, rebound or guarding.  Musculoskeletal: Normal range of motion. No edema and no tenderness.  Lymphadenopathy: No lymphadenopathy noted, cervical, inguinal. Neuro: Alert. Normal reflexes, muscle tone coordination. No cranial nerve deficit. Skin: Skin is warm and dry. No rash noted. Not diaphoretic. No erythema. No pallor.  Psychiatric: Normal mood and affect. Behavior, judgment, thought content normal.    ED Course  Procedures (including critical care time)  Labs Reviewed - No data to display No results found.   1. Tobacco abuse - discussed cessation of smoking in detail, over 15 minutes spent, pt verbalized understanding, she will consider Nicotine patch and will let us know  2. Leg pain, bilateral - follows with Dr. August Saucer, ortho      MDM  Tobacco abuse         Dorothea Ogle, MD 05/01/12 1330

## 2012-05-02 NOTE — ED Notes (Signed)
Patient has an appt 4/11 @ 2pm [place] [facility]

## 2012-05-03 ENCOUNTER — Ambulatory Visit (INDEPENDENT_AMBULATORY_CARE_PROVIDER_SITE_OTHER): Payer: Medicaid Other | Admitting: Internal Medicine

## 2012-05-03 ENCOUNTER — Encounter: Payer: Self-pay | Admitting: Internal Medicine

## 2012-05-03 VITALS — BP 120/70 | HR 77 | Temp 98.1°F | Ht 66.5 in

## 2012-05-03 DIAGNOSIS — J209 Acute bronchitis, unspecified: Secondary | ICD-10-CM

## 2012-05-03 DIAGNOSIS — R0609 Other forms of dyspnea: Secondary | ICD-10-CM

## 2012-05-03 DIAGNOSIS — R918 Other nonspecific abnormal finding of lung field: Secondary | ICD-10-CM

## 2012-05-03 DIAGNOSIS — R0600 Dyspnea, unspecified: Secondary | ICD-10-CM

## 2012-05-03 MED ORDER — LEVOFLOXACIN 500 MG PO TABS: 500.0000 mg | ORAL_TABLET | Freq: Every day | ORAL | Status: DC
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
## 2012-05-03 NOTE — Patient Instructions (Addendum)
take levaquin 500mg  once daily  X 6 days for possible acute bronchitis Complete CT scan of the chest and full pulmonary function test breathing test in 2 months from now Return to see me after the test

## 2012-05-04 DIAGNOSIS — R918 Other nonspecific abnormal finding of lung field: Secondary | ICD-10-CM | POA: Insufficient documentation

## 2012-05-04 DIAGNOSIS — J209 Acute bronchitis, unspecified: Secondary | ICD-10-CM | POA: Insufficient documentation

## 2012-05-04 DIAGNOSIS — R0600 Dyspnea, unspecified: Secondary | ICD-10-CM | POA: Insufficient documentation

## 2012-05-04 NOTE — Assessment & Plan Note (Signed)
Noticed on CT scan of the chest early April 2014 while she had a chest x-ray for admission for foot fracture. Low probability for malignancy. Repeat followup CT scan of the chest in 2 months

## 2012-05-04 NOTE — Assessment & Plan Note (Signed)
She thinks she has acute bronchitis and is requesting antibiotics   Plan Levaquin 6 days

## 2012-05-04 NOTE — Assessment & Plan Note (Signed)
chronic dyspnea, chronic cough with yellow sputum. Has history of childhood asthma.   Plan  pulmonary function testing

## 2012-05-13 NOTE — Discharge Summary (Signed)
Physician Discharge Summary  Patient ID: Rebekah Murillo MRN: [id number] DOB/AGE: 1965/12/11 47 y.o.  Admit date: 04/23/2012 Discharge date: 05/13/2012  Admission Diagnoses:  Active Problems:   Chronic obstructive airway disease with asthma   Tobacco abuse   PTSD (post-traumatic stress disorder)   Depression   Abnormal CXR (chest x-ray) non union lateral malleolus fracture  Discharge Diagnoses:  Same  Surgeries: Procedure(s): Right ankle fracture/non-union takedown, grafting, plating on 04/23/2012   Consultants: Treatment Team:  Laveda Norman, MD Mahala Menghini, MD  Discharged Condition: [facility] Course: Rebekah Murillo is an 47 y.o. 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  They were brought to the operating room on 04/23/2012 and underwent the above named procedures.  She tolerated the procedure well - was seen by PT before dc and sent home in good condition tdwb on right side  Antibiotics given:  Anti-infectives   Start     Dose/Rate Route Frequency Ordered Stop   04/25/12 1000  azithromycin (ZITHROMAX) tablet 250 mg  Status:  Discontinued     250 mg Oral Daily 04/24/12 0943 04/24/12 1532   04/24/12 1100  azithromycin (ZITHROMAX) tablet 500 mg     500 mg Oral Daily 04/24/12 0943 04/24/12 1100   04/23/12 1800  clindamycin (CLEOCIN) IVPB 600 mg     600 mg 100 mL/hr over 30 Minutes Intravenous Every 8 hours 04/23/12 1456 04/24/12 0220   04/23/12 1100  clindamycin (CLEOCIN) IVPB 600 mg  Status:  Discontinued     600 mg 100 mL/hr over 30 Minutes Intravenous  Once 04/23/12 1117 04/23/12 1456   04/23/12 1046  clindamycin (CLEOCIN) 600 MG/50ML IVPB    Comments:  HUNT, JENNIFER: cabinet override      04/23/12 1046 04/23/12 1057    .  Recent vital signs:  Filed Vitals:   04/24/12 0639  BP: 137/69  Pulse: 84  Temp: 98.3 F (36.8 C)  Resp: 17    Recent laboratory studies:   Results for orders placed during the hospital encounter of 04/19/12  SURGICAL PCR SCREEN      Result Value Range   MRSA, PCR NEGATIVE  NEGATIVE   Staphylococcus aureus NEGATIVE  NEGATIVE  CBC      Result Value Range   WBC 12.0 (*) 4.0 - 10.5 K/uL   RBC 4.55  3.87 - 5.11 MIL/uL   Hemoglobin 14.1  12.0 - 15.0 g/dL   HCT 40.9  81.1 - 91.4 %   MCV 90.1  78.0 - 100.0 fL   MCH 31.0  26.0 - 34.0 pg   MCHC 34.4  30.0 - 36.0 g/dL   RDW 78.2  95.6 - 21.3 %   Platelets 465 (*) 150 - 400 K/uL    Discharge Medications:     Medication List    STOP taking these medications       traMADol 50 MG tablet  Commonly known as:  ULTRAM      TAKE these medications       aspirin EC 325 MG tablet  Take 1 tablet (325 mg total) by mouth daily.     gabapentin 300 MG capsule  Commonly known as:  NEURONTIN  Take 300 mg by mouth 2 (two) times daily.     methocarbamol 500 MG tablet  Commonly known as:  ROBAXIN  Take 1 tablet (500 mg total) by mouth every 6 (six) hours as needed.  naproxen 500 MG tablet  Commonly known as:  NAPROSYN  Take 1 tablet (500 mg total) by mouth 3 (three) times daily with meals.     oxyCODONE-acetaminophen 10-325 MG per tablet  Commonly known as:  PERCOCET  Take 1 tablet by mouth every 4 (four) hours as needed for pain.        Diagnostic Studies: Dg Chest 2 View  04/19/2012  *RADIOLOGY REPORT*  Clinical Data: History of asthma  CHEST - 2 VIEW  Comparison: None.  Findings: The heart and pulmonary vascularity are within normal limits.  The lungs are well-aerated bilaterally without focal infiltrate.  A small nodular density is identified overlying the right hemidiaphragm.  This likely represents a small granuloma.  IMPRESSION: Likely small granuloma in the right base.  Follow-up imaging is recommended to assess for stability.   Original Report Authenticated By: Alcide Clever, M.D.    Dg Ankle 2 Views Right  04/23/2012  *RADIOLOGY REPORT*  Clinical Data: Nonunion of the  ankle fracture.  DG C-ARM 1-60 MIN,RIGHT ANKLE - 2 VIEW  Technique: Single intraoperative fluoroscopic spot film in the frontal projection.  Comparison:  CT 03/27/2012.  Findings: Single frontal fluoroscopic spot film demonstrates placement of the lateral malleolar plate and screw fixation apparatus across the transverse fibular/lateral malleolar fracture.  IMPRESSION: ORIF of the right lateral malleolar fracture.   Original Report Authenticated By: Andreas Newport, M.D.    Ct Chest Wo Contrast  04/24/2012  *RADIOLOGY REPORT*  Clinical Data: Smoker.  Follow-up nodule on preoperative chest x- ray.  CT CHEST WITHOUT CONTRAST  Technique:  Multidetector CT imaging of the chest was performed following the standard protocol without IV contrast.  Comparison: Chest x-ray 04/19/2012  Findings: Examination is obtained without IV contrast material due to no IV access.  There are focal areas of linear atelectasis in the lung bases bilaterally.  There are patchy focal nodular and confluent areas of ground-glass opacities distributed throughout the right lung. Given the history recent surgery, this could represent atelectasis or inflammatory process.  Multifocal pneumonia is not excluded. The nodule demonstrated on chest x-ray is not definitively identified although the atelectasis and ground-glass changes could account for the nodule or could obscure the nodule.  A followup study in 3 months is recommended for further evaluation.  Normal heart size.  Normal caliber thoracic aorta with mild calcification.  Small calcified lymph nodes in the mediastinum.  No evidence of significant lymphadenopathy in the chest.  The esophagus is decompressed.  No pneumothorax.  No pleural effusions. Degenerative changes in the thoracic spine.  IMPRESSION: Linear atelectasis in both lung bases.  Patchy focal and confluent areas of ground-glass nodularity scattered throughout the right lung.  These likely represent atelectasis or inflammatory  process. 58-month follow-up study is recommended for further evaluation.  The nodule identified on chest radiograph is not specifically identified although it could be caused by or obscured by the other parenchymal process.   Original Report Authenticated By: Burman Nieves, M.D.    Dg Ankle Right Port  04/23/2012  *RADIOLOGY REPORT*  Clinical Data: Postop ankle fracture, ORIF  PORTABLE RIGHT ANKLE - 2 VIEW  Comparison: 04/23/2012, 03/27/2012  Findings: Plate screw fixation has been performed of the right ankle lateral malleolar fracture.  Anatomic alignment.  Lucency in the distal tibia is presumed from a bone harvest site.  No acute osseous finding or hardware abnormality.  IMPRESSION: Expected appearance status post right ankle lateral malleolar ORIF. Anatomic alignment.  No complicating feature.   Original  Report Authenticated By: Judie Petit. Miles Costain, M.D.    Dg C-arm 1-60 Min  04/23/2012  *RADIOLOGY REPORT*  Clinical Data: Nonunion of the ankle fracture.  DG C-ARM 1-60 MIN,RIGHT ANKLE - 2 VIEW  Technique: Single intraoperative fluoroscopic spot film in the frontal projection.  Comparison:  CT 03/27/2012.  Findings: Single frontal fluoroscopic spot film demonstrates placement of the lateral malleolar plate and screw fixation apparatus across the transverse fibular/lateral malleolar fracture.  IMPRESSION: ORIF of the right lateral malleolar fracture.   Original Report Authenticated By: Andreas Newport, M.D.     Disposition: 01-Home or Self Care      Discharge Orders   Future Appointments Provider Department Dept Phone   07/01/2012 10:30 AM [facility] [address] [phone]   Patient to arrive 15 minutes prior to appointment time. No solid food 4 hours prior to exam. Liquids and Medicines are okay.   Future Orders Complete By Expires     Call MD / Call 911  As directed     Comments:      If you experience chest pain or shortness of breath, CALL 911 and be transported to the  hospital emergency room.  If you develope a fever above 101 F, pus (white drainage) or increased drainage or redness at the wound, or calf pain, call your surgeon's office.    Constipation Prevention  As directed     Comments:      Drink plenty of fluids.  Prune juice may be helpful.  You may use a stool softener, such as Colace (over the counter) 100 mg twice a day.  Use MiraLax (over the counter) for constipation as needed.    Diet - low sodium heart healthy  As directed     Discharge instructions  As directed     Comments:      1. 50 percent weight bearing right foot 2. Keep incision dry 3. Return to clinic 7 days    Increase activity slowly as tolerated  As directed           Signed: DEAN,GREGORY SCOTT 05/13/2012, 2:14 PM

## 2012-05-17 ENCOUNTER — Emergency Department (HOSPITAL_COMMUNITY)
Admission: EM | Admit: 2012-05-17 | Discharge: 2012-05-17 | Disposition: A | Discharge status: Home / Self Care | Payer: Medicaid Other | Attending: Emergency Medicine | Admitting: Emergency Medicine

## 2012-05-17 ENCOUNTER — Encounter (HOSPITAL_COMMUNITY): Payer: Self-pay | Admitting: Emergency Medicine

## 2012-05-17 ENCOUNTER — Emergency Department (HOSPITAL_COMMUNITY): Payer: Medicaid Other

## 2012-05-17 DIAGNOSIS — W010XXA Fall on same level from slipping, tripping and stumbling without subsequent striking against object, initial encounter: Secondary | ICD-10-CM | POA: Insufficient documentation

## 2012-05-17 DIAGNOSIS — Z8781 Personal history of (healed) traumatic fracture: Secondary | ICD-10-CM | POA: Insufficient documentation

## 2012-05-17 DIAGNOSIS — F329 Major depressive disorder, single episode, unspecified: Secondary | ICD-10-CM | POA: Insufficient documentation

## 2012-05-17 DIAGNOSIS — W19XXXA Unspecified fall, initial encounter: Secondary | ICD-10-CM

## 2012-05-17 DIAGNOSIS — M79672 Pain in left foot: Secondary | ICD-10-CM

## 2012-05-17 DIAGNOSIS — Y93E1 Activity, personal bathing and showering: Secondary | ICD-10-CM | POA: Insufficient documentation

## 2012-05-17 DIAGNOSIS — Z87891 Personal history of nicotine dependence: Secondary | ICD-10-CM | POA: Insufficient documentation

## 2012-05-17 DIAGNOSIS — F17200 Nicotine dependence, unspecified, uncomplicated: Secondary | ICD-10-CM | POA: Insufficient documentation

## 2012-05-17 DIAGNOSIS — S8990XA Unspecified injury of unspecified lower leg, initial encounter: Secondary | ICD-10-CM | POA: Insufficient documentation

## 2012-05-17 DIAGNOSIS — F3289 Other specified depressive episodes: Secondary | ICD-10-CM | POA: Insufficient documentation

## 2012-05-17 DIAGNOSIS — R21 Rash and other nonspecific skin eruption: Secondary | ICD-10-CM | POA: Insufficient documentation

## 2012-05-17 DIAGNOSIS — Z872 Personal history of diseases of the skin and subcutaneous tissue: Secondary | ICD-10-CM | POA: Insufficient documentation

## 2012-05-17 DIAGNOSIS — Y929 Unspecified place or not applicable: Secondary | ICD-10-CM | POA: Insufficient documentation

## 2012-05-17 DIAGNOSIS — S99929A Unspecified injury of unspecified foot, initial encounter: Secondary | ICD-10-CM | POA: Insufficient documentation

## 2012-05-17 DIAGNOSIS — J45909 Unspecified asthma, uncomplicated: Secondary | ICD-10-CM | POA: Insufficient documentation

## 2012-05-17 DIAGNOSIS — F4310 Post-traumatic stress disorder, unspecified: Secondary | ICD-10-CM | POA: Insufficient documentation

## 2012-05-17 MED ORDER — OXYCODONE-ACETAMINOPHEN 5-325 MG PO TABS
2.0000 | ORAL_TABLET | Freq: Once | ORAL | Status: AC
  Administered 2012-05-17: 2 via ORAL
  Filled 2012-05-17: qty 2

## 2012-05-17 NOTE — ED Provider Notes (Signed)
History  This chart was scribed for non-physician practitioner Jaci Carrel, PA-C working with Celene Kras, MD, by Candelaria Stagers, ED Scribe. This patient was seen in room WTR6/WTR6 and the patient's care was started at 5:20 PM   CSN: [id number]  Arrival date & time 05/17/12  1632   First MD Initiated Contact with Patient 05/17/12 1641      Chief Complaint  Patient presents with  . Foot Injury     The history is provided by the patient. No language interpreter was used.   Rebekah Murillo is a 47 y.o. female who presents to the Emergency Department complaining of left foot pain that started earlier today after she slipped and fell in the shower.  Pt denies hitting her head or LOC.  She is ambulatory with pain.  Pt has h/o fracture to the left foot and previous surgery to the left foot.  Pt has taken naproxen with no relief.  Pt also states she has a rash to the left arm and request referral to dermatologist.  Pt reports she has a follow up with her orthopaedist in three weeks.        Past Medical History  Diagnosis Date  . Major depression   . PTSD (post-traumatic stress disorder)   . Asthma   . Eczema     Past Surgical History  Procedure Laterality Date  . Appendectomy    . Bunionectomy      bilateral  . Orif ankle fracture Right 04/23/2012    Procedure: Right ankle fracture/non-union takedown, grafting, plating;  Surgeon: Cammy Copa, MD;  Location: [facility] OR;  Service: Orthopedics;  Laterality: Right;  Right ankle fracture non-union takedown, grafting, plating     No family history on file.  History  Substance Use Topics  . Smoking status: Current Every Day Smoker -- 1.00 packs/day for 31 years  . Smokeless tobacco: Not on file     Comment: Patient reports trying to cut back reports was smoking 2 packs a day  . Alcohol Use: 1.8 oz/week    3 Cans of beer per week     Comment: intermittent    OB History   Grav Para Term Preterm Abortions TAB SAB Ect Mult Living                Review of Systems  Allergies  Codeine; Penicillins; and Tramadol  Home Medications   Current Outpatient Rx  Name  Route  Sig  Dispense  Refill  . gabapentin (NEURONTIN) 300 MG capsule   Oral   Take 300 mg by mouth 2 (two) times daily.         . methocarbamol (ROBAXIN) 500 MG tablet   Oral   Take 500 mg by mouth every 6 (six) hours as needed (as needed for muscle spasms).         . naproxen (NAPROSYN) 500 MG tablet   Oral   Take 1 tablet (500 mg total) by mouth 3 (three) times daily with meals.   30 tablet   0   . oxyCODONE-acetaminophen (PERCOCET) 10-325 MG per tablet   Oral   Take 1 tablet by mouth every 4 (four) hours as needed for pain.   60 tablet   0     BP 128/86  Pulse 86  Temp(Src) 98.9 F (37.2 C) (Oral)  Resp 16  SpO2 98%  Physical Exam  Nursing note and vitals reviewed. Constitutional: She is oriented to person, place, and time. She appears well-developed and  well-nourished. No distress.  HENT:  Head: Normocephalic and atraumatic.  Eyes: Conjunctivae and EOM are normal.  Neck: Normal range of motion. Neck supple.  Cardiovascular:  Intact distal pulses, capillary refill < 3 seconds  Pulmonary/Chest: Effort normal.  Musculoskeletal: Normal range of motion.  Left ankle, pain w inversion/eversion. ttp over forefoot and lat/med malleolus. Weight bearing without difficulty. All other extremities with normal ROM  Neurological: She is alert and oriented to person, place, and time.  Normal ambulation. No sensory deficit  Skin: Skin is warm and dry. No rash noted. She is not diaphoretic.  Skin intact, no obvious deformity  Psychiatric: She has a normal mood and affect. Her behavior is normal.    ED Course  Procedures   DIAGNOSTIC STUDIES: Oxygen Saturation is 98% on room air, normal by my interpretation.    COORDINATION OF CARE:  5:22 PM Discussed course of care with pt which includes xray of left foot.  Pt understands and agrees.    Labs Reviewed - No data to display Dg Ankle Complete Left  05/17/2012  *RADIOLOGY REPORT*  Clinical Data: History of fall complaining of pain in the medial aspect of the left foot and ankle.  LEFT ANKLE COMPLETE - 3+ VIEW  Comparison: No priors.  Findings: Four views of the left ankle demonstrate no acute displaced fracture, subluxation, dislocation, joint or soft tissue abnormality.  Orthopedic fixation hardware in the first digit is incompletely visualized.  IMPRESSION: 1.  No acute radiographic abnormality of the left ankle.   Original Report Authenticated By: Trudie Reed, M.D.    Dg Foot Complete Left  05/17/2012  *RADIOLOGY REPORT*  Clinical Data: Injury to the medial aspect of the left foot.  LEFT FOOT - COMPLETE 3+ VIEW  Comparison: No priors.  Findings: Postoperative changes of bunionectomy are noted.  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  Three lag screws are noted, two of which are within the distal first metatarsal, with an additional lag screw extending along the length of the first proximal phalanx; these lag screws are unusual for prior bunionectomy, indicating that there may have been prior trauma as well.  No residual fractures are noted at this time.  No acute displaced fracture, subluxation or dislocation.  No soft tissue abnormalities appreciated.  IMPRESSION: 1.  No acute radiographic abnormality of the left foot. 2. Postoperative changes of prior bunionectomy and fixation in the first digit, as above, with advanced degenerative changes at the first MTP joint.   Original Report Authenticated By: Trudie Reed, M.D.      No diagnosis found.    MDM  Fall, foot pain Pt presents to ED status post mechanical fall in shower complaining of left foot pain.  Pt has chronic h/o bilateral foot problems followed by Dr. August Saucer.  Drug database query report shows he gave her 60 percocet-10s on 04/24/12 and another 60 hydrocodone-10s on 05/01/12.   Patient X-Ray negative for obvious fracture or dislocation.  It is felt that a prescription is not indicated at this time. Pain managed in ED. Pt advised to follow up with orthopedics. Patient will be dc home & is agreeable with above plan.  At dc pt requested a new orthopedic doctor explaining that Dr. August Saucer "does not do feet" and that her previous foot surgeries were done in [place]. Will refer to on call orthopedist.   I personally performed the services described in this documentation, which was scribed in my presence. The recorded information has been  reviewed and is accurate.         Jaci Carrel, PA-C 05/17/12 [address] [place] 05/17/12 [phone]

## 2012-05-17 NOTE — ED Notes (Addendum)
Pt c/o "2 broke feet x2 weeks.  Reports hx of surgery. Pt is ambulatory with cane.

## 2012-05-18 NOTE — ED Provider Notes (Signed)
Medical screening examination/treatment/procedure(s) were performed by non-physician practitioner and as supervising physician I was immediately available for consultation/collaboration.     Dekari Bures R Akane Tessier, MD 05/18/12 0005 

## 2012-05-19 ENCOUNTER — Emergency Department (HOSPITAL_COMMUNITY)
Admission: EM | Admit: 2012-05-19 | Discharge: 2012-05-19 | Disposition: A | Discharge status: Home / Self Care | Payer: Medicaid Other | Source: Home / Self Care | Attending: Family Medicine | Admitting: Family Medicine

## 2012-05-19 ENCOUNTER — Encounter (HOSPITAL_COMMUNITY): Payer: Self-pay | Admitting: Emergency Medicine

## 2012-05-19 DIAGNOSIS — Z765 Malingerer [conscious simulation]: Secondary | ICD-10-CM

## 2012-05-19 DIAGNOSIS — M79609 Pain in unspecified limb: Secondary | ICD-10-CM

## 2012-05-19 DIAGNOSIS — F1910 Other psychoactive substance abuse, uncomplicated: Secondary | ICD-10-CM
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
## 2012-06-04 ENCOUNTER — Emergency Department (HOSPITAL_COMMUNITY)
Admission: EM | Admit: 2012-06-04 | Discharge: 2012-06-05 | Disposition: A | Discharge status: Home / Self Care | Payer: Medicaid Other | Attending: Emergency Medicine | Admitting: Emergency Medicine

## 2012-06-04 ENCOUNTER — Encounter (HOSPITAL_COMMUNITY): Payer: Self-pay | Admitting: Emergency Medicine

## 2012-06-04 ENCOUNTER — Emergency Department (HOSPITAL_COMMUNITY): Payer: Medicaid Other

## 2012-06-04 DIAGNOSIS — M549 Dorsalgia, unspecified: Secondary | ICD-10-CM

## 2012-06-04 DIAGNOSIS — Z9889 Other specified postprocedural states: Secondary | ICD-10-CM | POA: Insufficient documentation

## 2012-06-04 DIAGNOSIS — IMO0002 Reserved for concepts with insufficient information to code with codable children: Secondary | ICD-10-CM | POA: Insufficient documentation

## 2012-06-04 DIAGNOSIS — J45909 Unspecified asthma, uncomplicated: Secondary | ICD-10-CM | POA: Insufficient documentation

## 2012-06-04 DIAGNOSIS — Y9389 Activity, other specified: Secondary | ICD-10-CM | POA: Insufficient documentation

## 2012-06-04 DIAGNOSIS — Y92410 Unspecified street and highway as the place of occurrence of the external cause: Secondary | ICD-10-CM | POA: Insufficient documentation

## 2012-06-04 DIAGNOSIS — Z880 Allergy status to penicillin: Secondary | ICD-10-CM | POA: Insufficient documentation

## 2012-06-04 DIAGNOSIS — M25571 Pain in right ankle and joints of right foot: Secondary | ICD-10-CM

## 2012-06-04 DIAGNOSIS — M25572 Pain in left ankle and joints of left foot: Secondary | ICD-10-CM

## 2012-06-04 DIAGNOSIS — Z79899 Other long term (current) drug therapy: Secondary | ICD-10-CM | POA: Insufficient documentation

## 2012-06-04 DIAGNOSIS — M79671 Pain in right foot: Secondary | ICD-10-CM

## 2012-06-04 DIAGNOSIS — Z8659 Personal history of other mental and behavioral disorders: Secondary | ICD-10-CM | POA: Insufficient documentation

## 2012-06-04 DIAGNOSIS — R21 Rash and other nonspecific skin eruption: Secondary | ICD-10-CM

## 2012-06-04 DIAGNOSIS — Z872 Personal history of diseases of the skin and subcutaneous tissue: Secondary | ICD-10-CM | POA: Insufficient documentation

## 2012-06-04 DIAGNOSIS — S8990XA Unspecified injury of unspecified lower leg, initial encounter: Secondary | ICD-10-CM | POA: Insufficient documentation

## 2012-06-04 DIAGNOSIS — F17200 Nicotine dependence, unspecified, uncomplicated: Secondary | ICD-10-CM | POA: Insufficient documentation

## 2012-06-04 DIAGNOSIS — M533 Sacrococcygeal disorders, not elsewhere classified: Secondary | ICD-10-CM

## 2012-06-04 MED ORDER — HYDROCORTISONE 1 % EX CREA
TOPICAL_CREAM | Freq: Three times a day (TID) | CUTANEOUS | Status: DC
  Filled 2012-06-04: qty 28

## 2012-06-04 MED ORDER — NAPROXEN 250 MG PO TABS: 500.0000 mg | ORAL_TABLET | Freq: Once | ORAL | Status: DC

## 2012-06-04 MED ORDER — ACETAMINOPHEN 325 MG PO TABS
650.0000 mg | ORAL_TABLET | ORAL | Status: AC
  Administered 2012-06-04: 650 mg via ORAL
  Filled 2012-06-04: qty 2
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
## 2012-06-05 ENCOUNTER — Emergency Department (HOSPITAL_COMMUNITY): Payer: Medicaid Other

## 2012-06-05 NOTE — ED Notes (Signed)
See Epic Downtime forms for charting and discharge

## 2012-06-11 ENCOUNTER — Telehealth: Payer: Self-pay | Admitting: Internal Medicine

## 2012-06-11 NOTE — Telephone Encounter (Signed)
Pt states that she received a letter that states that chest ct scheduled on 07-01-12 will not be approved until further info regarding size of nodule is received from provider.  [name], do you know about this?

## 2012-06-11 NOTE — Telephone Encounter (Signed)
I have the approval letter  for this CT thru [employer] that was faxed on 06/06/12. (will have scanned in patient chart.)  Spoke with Rebekah Murillo and informed her of this. Rebekah Murillo has CT sched 06/27/12 @ 10:30 LB CT.Kandice Hams

## 2012-06-11 NOTE — Telephone Encounter (Signed)
Pt calling again in ref to previous msg can be reached at [phone].Rebekah Murillo

## 2012-06-15 ENCOUNTER — Emergency Department (HOSPITAL_COMMUNITY)
Admission: EM | Admit: 2012-06-15 | Discharge: 2012-06-15 | Disposition: A | Discharge status: Home / Self Care | Payer: Medicaid Other | Attending: Emergency Medicine | Admitting: Emergency Medicine

## 2012-06-15 ENCOUNTER — Encounter (HOSPITAL_COMMUNITY): Payer: Self-pay | Admitting: Family Medicine

## 2012-06-15 DIAGNOSIS — Z8659 Personal history of other mental and behavioral disorders: Secondary | ICD-10-CM | POA: Insufficient documentation

## 2012-06-15 DIAGNOSIS — Z87828 Personal history of other (healed) physical injury and trauma: Secondary | ICD-10-CM | POA: Insufficient documentation

## 2012-06-15 DIAGNOSIS — L218 Other seborrheic dermatitis: Secondary | ICD-10-CM | POA: Insufficient documentation

## 2012-06-15 DIAGNOSIS — F17200 Nicotine dependence, unspecified, uncomplicated: Secondary | ICD-10-CM | POA: Insufficient documentation

## 2012-06-15 DIAGNOSIS — J45909 Unspecified asthma, uncomplicated: Secondary | ICD-10-CM | POA: Insufficient documentation

## 2012-06-15 DIAGNOSIS — L210 Seborrhea capitis: Secondary | ICD-10-CM

## 2012-06-15 DIAGNOSIS — Z79899 Other long term (current) drug therapy: Secondary | ICD-10-CM | POA: Insufficient documentation

## 2012-06-15 DIAGNOSIS — L089 Local infection of the skin and subcutaneous tissue, unspecified: Secondary | ICD-10-CM | POA: Insufficient documentation

## 2012-06-15 MED ORDER — CEPHALEXIN 500 MG PO CAPS: 500.0000 mg | ORAL_CAPSULE | Freq: Four times a day (QID) | ORAL | Status: DC

## 2012-06-15 MED ORDER — GABAPENTIN 300 MG PO CAPS: 600.0000 mg | ORAL_CAPSULE | Freq: Three times a day (TID) | ORAL | Status: DC

## 2012-06-15 MED ORDER — HYDROCODONE-ACETAMINOPHEN 5-325 MG PO TABS: 1.0000 | ORAL_TABLET | ORAL | Status: DC | PRN

## 2012-06-15 NOTE — ED Notes (Signed)
Pt complaining of blisters on the back of her head.

## 2012-06-15 NOTE — ED Provider Notes (Signed)
Medical screening examination/treatment/procedure(s) were performed by non-physician practitioner and as supervising physician I was immediately available for consultation/collaboration.   Carleene Cooper III, MD 06/15/12 [phone]
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
## 2012-06-20 ENCOUNTER — Ambulatory Visit: Payer: Medicaid Other | Attending: Internal Medicine | Admitting: Internal Medicine

## 2012-06-20 VITALS — BP 144/72 | HR 82 | Temp 98.3°F | Resp 16 | Wt 202.2 lb

## 2012-06-20 DIAGNOSIS — J449 Chronic obstructive pulmonary disease, unspecified: Secondary | ICD-10-CM

## 2012-06-20 DIAGNOSIS — F17200 Nicotine dependence, unspecified, uncomplicated: Secondary | ICD-10-CM | POA: Insufficient documentation

## 2012-06-20 DIAGNOSIS — Z720 Tobacco use: Secondary | ICD-10-CM

## 2012-06-20 DIAGNOSIS — M549 Dorsalgia, unspecified: Secondary | ICD-10-CM | POA: Insufficient documentation

## 2012-06-20 DIAGNOSIS — Z0000 Encounter for general adult medical examination without abnormal findings: Secondary | ICD-10-CM | POA: Insufficient documentation

## 2012-06-20 DIAGNOSIS — F4310 Post-traumatic stress disorder, unspecified: Secondary | ICD-10-CM | POA: Insufficient documentation

## 2012-06-20 DIAGNOSIS — R918 Other nonspecific abnormal finding of lung field: Secondary | ICD-10-CM | POA: Insufficient documentation

## 2012-06-20 DIAGNOSIS — Z4789 Encounter for other orthopedic aftercare: Secondary | ICD-10-CM | POA: Insufficient documentation

## 2012-06-20 DIAGNOSIS — Z886 Allergy status to analgesic agent status: Secondary | ICD-10-CM | POA: Insufficient documentation

## 2012-06-20 DIAGNOSIS — Z79899 Other long term (current) drug therapy: Secondary | ICD-10-CM | POA: Insufficient documentation

## 2012-06-20 DIAGNOSIS — Z881 Allergy status to other antibiotic agents status: Secondary | ICD-10-CM | POA: Insufficient documentation

## 2012-06-20 DIAGNOSIS — L259 Unspecified contact dermatitis, unspecified cause: Secondary | ICD-10-CM | POA: Insufficient documentation

## 2012-06-20 DIAGNOSIS — J45909 Unspecified asthma, uncomplicated: Secondary | ICD-10-CM | POA: Insufficient documentation

## 2012-06-20 DIAGNOSIS — Z885 Allergy status to narcotic agent status: Secondary | ICD-10-CM | POA: Insufficient documentation

## 2012-06-20 DIAGNOSIS — F329 Major depressive disorder, single episode, unspecified: Secondary | ICD-10-CM | POA: Insufficient documentation

## 2012-06-20 DIAGNOSIS — I10 Essential (primary) hypertension: Secondary | ICD-10-CM | POA: Insufficient documentation

## 2012-06-20 MED ORDER — HYDROCODONE-ACETAMINOPHEN 5-325 MG PO TABS: 1.0000 | ORAL_TABLET | Freq: Four times a day (QID) | ORAL | Status: DC | PRN

## 2012-06-20 MED ORDER — RAMIPRIL 2.5 MG PO CAPS: 2.5000 mg | ORAL_CAPSULE | Freq: Every day | ORAL | Status: AC

## 2012-06-20 MED ORDER — GABAPENTIN 300 MG PO CAPS: 600.0000 mg | ORAL_CAPSULE | Freq: Three times a day (TID) | ORAL | Status: DC

## 2012-06-20 NOTE — Progress Notes (Signed)
Patient here to establish care Medication refills 
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
## 2012-06-27 ENCOUNTER — Ambulatory Visit (INDEPENDENT_AMBULATORY_CARE_PROVIDER_SITE_OTHER)
Admission: RE | Admit: 2012-06-27 | Discharge: 2012-06-27 | Disposition: A | Discharge status: Home / Self Care | Payer: Medicaid Other | Source: Ambulatory Visit | Attending: Internal Medicine | Admitting: Internal Medicine

## 2012-06-27 DIAGNOSIS — R0600 Dyspnea, unspecified: Secondary | ICD-10-CM

## 2012-06-27 DIAGNOSIS — R918 Other nonspecific abnormal finding of lung field: Secondary | ICD-10-CM

## 2012-06-27 DIAGNOSIS — R0989 Other specified symptoms and signs involving the circulatory and respiratory systems: Secondary | ICD-10-CM

## 2012-07-01 ENCOUNTER — Other Ambulatory Visit: Payer: Medicaid Other

## 2012-07-01 ENCOUNTER — Ambulatory Visit: Payer: Medicaid Other

## 2012-07-02 ENCOUNTER — Telehealth: Payer: Self-pay | Admitting: Family Medicine

## 2012-07-02 ENCOUNTER — Telehealth: Payer: Self-pay | Admitting: Internal Medicine

## 2012-07-02 DIAGNOSIS — R911 Solitary pulmonary nodule: Secondary | ICD-10-CM

## 2012-07-02 NOTE — Telephone Encounter (Signed)
Pt urgently would refill for HYDROcodone-acetaminophen (NORCO/VICODIN) 5-325 MG per tablet. Pharmacy sent fax for Rx, please f/u with pt.  Pt also would like results for chest x-ray completed on 06/27/12.

## 2012-07-02 NOTE — Telephone Encounter (Signed)
I spoke with the pt and advised that MR out of office and that I will get him the message that she is asking for CT results from 06-27-12. Please advise. Carron Curie, CMA

## 2012-07-03 ENCOUNTER — Telehealth: Payer: Self-pay

## 2012-07-03 NOTE — Telephone Encounter (Signed)
Patient made aware that we will not refill her percocet Needs to follow up with pain management

## 2012-07-03 NOTE — Telephone Encounter (Signed)
Narcotics - pt was told clearly at her most recent appt that we will not be refilling her narcotics. The script he gave her was for one time only. If she would like, I am happy to place an order for referral to the pain clinic.   She did not have a cxr 6/5, she had a CT chest. It showed resolution of the pna. Pulmonology had ordered it so she should f/u with them regarding any future management.

## 2012-07-04 ENCOUNTER — Telehealth: Payer: Self-pay | Admitting: Internal Medicine

## 2012-07-04 NOTE — Telephone Encounter (Signed)
i spoke with pt. Advised will mail results to her. Confirmed mailing address. Nothing further was needed

## 2012-07-04 NOTE — Telephone Encounter (Signed)
LMTCB

## 2012-07-04 NOTE — Telephone Encounter (Signed)
I spoke with patient about results and she verbalized understanding and had no questions 

## 2012-07-04 NOTE — Telephone Encounter (Signed)
Ct June 2014  - shows clearing of pneumonia - but still there is a small patch of smokers ifnlammation in right upper lung  - for this if she does not quit smoking there is chance over next several years her lung can get scarred up. 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 Kalman Shan, M.D., [facility].C.P Pulmonary and Critical Care Medicine Staff Physician [facility] Pager: [phone], If no answer or between  15:00h - 7:00h: call [phone]  07/04/2012 10:46 AM

## 2012-07-05 ENCOUNTER — Telehealth: Payer: Self-pay | Admitting: Family Medicine

## 2012-07-05 NOTE — Telephone Encounter (Signed)
Pt wants hydrocodone script refilled.  Pt advised to make appt, scheduled appt for Mon. 07/08/12.

## 2012-07-08 ENCOUNTER — Ambulatory Visit: Payer: Medicaid Other

## 2012-07-11 ENCOUNTER — Ambulatory Visit: Payer: No Typology Code available for payment source | Attending: Family Medicine

## 2012-07-11 VITALS — BP 143/83 | HR 85 | Temp 98.8°F | Ht 66.5 in | Wt 204.6 lb

## 2012-07-11 DIAGNOSIS — G894 Chronic pain syndrome: Secondary | ICD-10-CM

## 2012-07-11 DIAGNOSIS — F17200 Nicotine dependence, unspecified, uncomplicated: Secondary | ICD-10-CM

## 2012-07-11 LAB — CBC WITH DIFFERENTIAL/PLATELET
Basophils Relative: 1 % (ref 0–1)
Eosinophils Absolute: 0.1 10*3/uL (ref 0.0–0.7)
Eosinophils Relative: 1 % (ref 0–5)
HCT: 41.8 % (ref 36.0–46.0)
Hemoglobin: 14.3 g/dL (ref 12.0–15.0)
MCH: 30 pg (ref 26.0–34.0)
MCHC: 34.2 g/dL (ref 30.0–36.0)
Monocytes Absolute: 0.5 10*3/uL (ref 0.1–1.0)
Monocytes Relative: 7 % (ref 3–12)
Neutrophils Relative %: 61 % (ref 43–77)

## 2012-07-11 LAB — COMPREHENSIVE METABOLIC PANEL
Albumin: 4.3 g/dL (ref 3.5–5.2)
Alkaline Phosphatase: 133 U/L — ABNORMAL HIGH (ref 39–117)
Calcium: 10.4 mg/dL (ref 8.4–10.5)
Chloride: 104 mEq/L (ref 96–112)
Glucose, Bld: 91 mg/dL (ref 70–99)
Potassium: 4.8 mEq/L (ref 3.5–5.3)
Sodium: 135 mEq/L (ref 135–145)
Total Protein: 8 g/dL (ref 6.0–8.3)

## 2012-07-11 MED ORDER — HYDROCODONE-ACETAMINOPHEN 5-325 MG PO TABS: 1.0000 | ORAL_TABLET | Freq: Four times a day (QID) | ORAL | Status: DC | PRN

## 2012-07-11 MED ORDER — MUPIROCIN CALCIUM 2 % EX CREA: TOPICAL_CREAM | Freq: Three times a day (TID) | CUTANEOUS | Status: DC

## 2012-07-11 MED ORDER — NICOTINE 21 MG/24HR TD PT24: 1.0000 | MEDICATED_PATCH | TRANSDERMAL | Status: DC

## 2012-07-11 NOTE — Progress Notes (Unsigned)
Patient ID: Rebekah Murillo, female   DOB: [birth year], 47 y.o.   MRN: [id number]  CC:  HPI: 47 year old [facility]. The patient recently had right ankle surgery by Dr. Rise Paganini. She has been to the ER several times for Percocet or Vicodin prescriptions. The patient has also seen Dr. Marchelle Gearing, for pulmonary opacity more strongly advised to quit smoking She states that she is worried about her weight gain in the last one year. She is open to trying physical therapy and needs a referral to go to a pain clinic for continued pain management   Allergies  Allergen Reactions  . Codeine Hives  . Naproxen Nausea And Vomiting  . Penicillins Hives and Nausea And Vomiting  . Tramadol     Seizure, does not like the way it makes her feel   Past Medical History  Diagnosis Date  . Major depression   . PTSD (post-traumatic stress disorder)   . Asthma   . Eczema    Current Outpatient Prescriptions on File Prior to Visit  Medication Sig Dispense Refill  . gabapentin (NEURONTIN) 300 MG capsule Take 2 capsules (600 mg total) by mouth 3 (three) times daily.  180 capsule  3  . ramipril (ALTACE) 2.5 MG capsule Take 1 capsule (2.5 mg total) by mouth daily.  30 capsule  3   No current facility-administered medications on file prior to visit.   No family history on file. History   Social History  . Marital Status: Single    Spouse Name: N/A    Number of Children: N/A  . Years of Education: N/A   Occupational History  . Not on file.   Social History Main Topics  . Smoking status: Current Every Day Smoker -- 1.00 packs/day for 31 years  . Smokeless tobacco: Not on file     Comment: Patient reports trying to cut back reports was smoking 2 packs a day  . Alcohol Use: 1.8 oz/week    3 Cans of beer per week     Comment: intermittent  . Drug Use: No  . Sexually Active: Not on file   Other Topics Concern  . Not on file   Social  History Narrative  . No narrative on file    Review of Systems  Constitutional: Negative for fever, chills, diaphoresis, activity change, appetite change and fatigue.  HENT: Negative for ear pain, nosebleeds, congestion, facial swelling, rhinorrhea, neck pain, neck stiffness and ear discharge.   Eyes: Negative for pain, discharge, redness, itching and visual disturbance.  Respiratory: Negative for cough, choking, chest tightness, shortness of breath, wheezing and stridor.   Cardiovascular: Negative for chest pain, palpitations and leg swelling.  Gastrointestinal: Negative for abdominal distention.  Genitourinary: Negative for dysuria, urgency, frequency, hematuria, flank pain, decreased urine volume, difficulty urinating and dyspareunia.  Musculoskeletal: Negative for back pain, joint swelling, arthralgias and gait problem.  Neurological: Negative for dizziness, tremors, seizures, syncope, facial asymmetry, speech difficulty, weakness, light-headedness, numbness and headaches.  Hematological: Negative for adenopathy. Does not bruise/bleed easily.  Psychiatric/Behavioral: Negative for hallucinations, behavioral problems, confusion, dysphoric mood, decreased concentration and agitation.    Objective:   Filed Vitals:   07/11/12 1208  BP: 143/83  Pulse: 85  Temp: 98.8 F (37.1 C)    Physical Exam  Constitutional: Appears well-developed and well-nourished. No distress.  HENT: Normocephalic. External right and left ear normal. Oropharynx is clear and moist.  Eyes: Conjunctivae and EOM are normal. PERRLA, no  scleral icterus.  Neck: Normal ROM. Neck supple. No JVD. No tracheal deviation. No thyromegaly.  CVS: RRR, S1/S2 +, no murmurs, no gallops, no carotid bruit.  Pulmonary: Effort and breath sounds normal, no stridor, rhonchi, wheezes, rales.  Abdominal: Soft. BS +,  no distension, tenderness, rebound or guarding.  Musculoskeletal: Normal range of motion. No edema and no tenderness.   Lymphadenopathy: No lymphadenopathy noted, cervical, inguinal. Neuro: Alert. Normal reflexes, muscle tone coordination. No cranial nerve deficit. Skin: Skin is warm and dry. No rash noted. Not diaphoretic. No erythema. No pallor.  Psychiatric: Normal mood and affect. Behavior, judgment, thought content normal.   Lab Results  Component Value Date   WBC 12.0* 04/19/2012   HGB 14.1 04/19/2012   HCT 41.0 04/19/2012   MCV 90.1 04/19/2012   PLT 465* 04/19/2012   Lab Results  Component Value Date   CREATININE 0.92 03/03/2011   BUN 9 03/03/2011   NA 136 03/03/2011   K 3.5 03/03/2011   CL 101 03/03/2011   CO2 26 03/03/2011    No results found for this basename: HGBA1C   Lipid Panel  No results found for this basename: chol, trig, hdl, cholhdl, vldl, ldlcalc       Assessment and plan:   Patient Active Problem List   Diagnosis Date Noted  . Acute bronchitis 05/04/2012  . Dyspnea 05/04/2012  . Pulmonary infiltrates 05/04/2012  . Chronic obstructive airway disease with asthma 04/23/2012  . Tobacco abuse 04/23/2012  . PTSD (post-traumatic stress disorder) 04/23/2012  . Depression 04/23/2012  . Abnormal CXR (chest x-ray) 04/23/2012   #1 chronic pain syndrome She is on gabapentin 600 mg 3 times a day She literally directly to give her 10 tablets of Vicodin and this has been provided Pain clinic referral has been provided Other providers I requested not to refill any more prescriptions She is to continue with gabapentin She refused Ultram and muscle relaxant   #2 nicotine dependence Advised to quit smoking

## 2012-07-21 ENCOUNTER — Emergency Department (HOSPITAL_COMMUNITY)
Admission: EM | Admit: 2012-07-21 | Discharge: 2012-07-22 | Disposition: A | Discharge status: Home / Self Care | Payer: Medicaid Other | Attending: Emergency Medicine | Admitting: Emergency Medicine

## 2012-07-21 ENCOUNTER — Encounter (HOSPITAL_COMMUNITY): Payer: Self-pay | Admitting: *Deleted

## 2012-07-21 DIAGNOSIS — Z880 Allergy status to penicillin: Secondary | ICD-10-CM | POA: Insufficient documentation

## 2012-07-21 DIAGNOSIS — M79672 Pain in left foot: Secondary | ICD-10-CM

## 2012-07-21 DIAGNOSIS — L259 Unspecified contact dermatitis, unspecified cause: Secondary | ICD-10-CM | POA: Insufficient documentation

## 2012-07-21 DIAGNOSIS — Z79899 Other long term (current) drug therapy: Secondary | ICD-10-CM | POA: Insufficient documentation

## 2012-07-21 DIAGNOSIS — Z888 Allergy status to other drugs, medicaments and biological substances status: Secondary | ICD-10-CM | POA: Insufficient documentation

## 2012-07-21 DIAGNOSIS — F4310 Post-traumatic stress disorder, unspecified: Secondary | ICD-10-CM | POA: Insufficient documentation

## 2012-07-21 DIAGNOSIS — G8929 Other chronic pain: Secondary | ICD-10-CM | POA: Insufficient documentation

## 2012-07-21 DIAGNOSIS — Z885 Allergy status to narcotic agent status: Secondary | ICD-10-CM | POA: Insufficient documentation

## 2012-07-21 DIAGNOSIS — M25579 Pain in unspecified ankle and joints of unspecified foot: Secondary | ICD-10-CM | POA: Insufficient documentation

## 2012-07-21 DIAGNOSIS — J45909 Unspecified asthma, uncomplicated: Secondary | ICD-10-CM | POA: Insufficient documentation

## 2012-07-21 DIAGNOSIS — F17200 Nicotine dependence, unspecified, uncomplicated: Secondary | ICD-10-CM | POA: Insufficient documentation

## 2012-07-21 DIAGNOSIS — F329 Major depressive disorder, single episode, unspecified: Secondary | ICD-10-CM | POA: Insufficient documentation
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
## 2012-07-22 ENCOUNTER — Ambulatory Visit: Payer: Medicaid Other

## 2012-07-22 MED ORDER — HYDROCODONE-ACETAMINOPHEN 7.5-325 MG/15ML PO SOLN: 10.0000 mL | Freq: Three times a day (TID) | ORAL | Status: DC | PRN

## 2012-07-22 MED ORDER — OXYCODONE-ACETAMINOPHEN 5-325 MG PO TABS
1.0000 | ORAL_TABLET | Freq: Once | ORAL | Status: AC
  Administered 2012-07-22: 1 via ORAL
  Filled 2012-07-22: qty 1

## 2012-07-22 NOTE — ED Notes (Signed)
0200  Pt is sleeping at this time.  Continue to monitor

## 2012-07-22 NOTE — ED Provider Notes (Signed)
History    CSN: [id number] Arrival date & time 07/21/12  2134  First MD Initiated Contact with Patient 07/22/12 0159     Chief Complaint  Patient presents with  . Muscle Pain   (Consider location/radiation/quality/duration/timing/severity/associated sxs/prior Treatment) HPI Hx per PT - has chronic foot pain left worse than right, she walks a lot.  She is requesting RX for pain medication and referral to a local podiatrist.  She is currently visiting family in the hospital. No trauma otherwise, no rash, no swelling, no N/V, h/o prior bunionectomy, pain is sharp and mod in severity   Past Medical History  Diagnosis Date  . Major depression   . PTSD (post-traumatic stress disorder)   . Asthma   . Eczema    Past Surgical History  Procedure Laterality Date  . Appendectomy    . Bunionectomy      bilateral  . Orif ankle fracture Right 04/23/2012    Procedure: Right ankle fracture/non-union takedown, grafting, plating;  Surgeon: Cammy Copa, MD;  Location: [facility] OR;  Service: Orthopedics;  Laterality: Right;  Right ankle fracture non-union takedown, grafting, plating    No family history on file. History  Substance Use Topics  . Smoking status: Current Every Day Smoker -- 1.00 packs/day for 31 years  . Smokeless tobacco: Not on file     Comment: Patient reports trying to cut back reports was smoking 2 packs a day  . Alcohol Use: 1.8 oz/week    3 Cans of beer per week     Comment: intermittent   OB History   Grav Para Term Preterm Abortions TAB SAB Ect Mult Living                 Review of Systems  Constitutional: Negative for fever and chills.  HENT: Negative for neck pain and neck stiffness.   Eyes: Negative for pain.  Respiratory: Negative for shortness of breath.   Cardiovascular: Negative for chest pain.  Gastrointestinal: Negative for abdominal pain.  Genitourinary: Negative for dysuria.  Musculoskeletal: Negative for back pain.  Skin: Negative for rash.   Neurological: Negative for headaches.  All other systems reviewed and are negative.    Allergies  Codeine; Naproxen; Penicillins; and Tramadol  Home Medications   Current Outpatient Rx  Name  Route  Sig  Dispense  Refill  . gabapentin (NEURONTIN) 300 MG capsule   Oral   Take 2 capsules (600 mg total) by mouth 3 (three) times daily.   180 capsule   3   . ramipril (ALTACE) 2.5 MG capsule   Oral   Take 1 capsule (2.5 mg total) by mouth daily.   30 capsule   3    BP 135/75  Pulse 72  Temp(Src) 97.8 F (36.6 C) (Oral)  Resp 20  SpO2 100% Physical Exam  Constitutional: She is oriented to person, place, and time. She appears well-developed and well-nourished.  HENT:  Head: Normocephalic and atraumatic.  Eyes: EOM are normal. Pupils are equal, round, and reactive to light.  Neck: Neck supple.  Cardiovascular: Normal rate, regular rhythm and intact distal pulses.   Pulmonary/Chest: Effort normal and breath sounds normal. No respiratory distress.  Musculoskeletal: Normal range of motion. She exhibits no edema.  Bilateral feet with normal motor, sensory, and pulses, no deformities, good ROM, no point tenderness  Neurological: She is alert and oriented to person, place, and time.  Gait intact  Skin: Skin is warm and dry.    ED Course  Procedures (including critical care time)  Percocet provided  No evidence of infection or trauma by exam, no indication for labs/ imaging  Plan d/c home, referral to podiatrist provided, RX provided with return precautions verbalized as understood.  MDM   Foot pain with h/o same Previous records reviewed including prior surgeries VS and nurses notes reviewed  Sunnie Nielsen, MD 07/22/12 [phone]

## 2012-07-30 ENCOUNTER — Telehealth: Payer: Self-pay | Admitting: Family Medicine

## 2012-08-02 ENCOUNTER — Ambulatory Visit: Payer: Medicaid Other

## 2012-08-02 ENCOUNTER — Encounter: Payer: Self-pay | Admitting: Physical Medicine & Rehabilitation

## 2012-08-06 NOTE — Telephone Encounter (Signed)
closed

## 2012-08-09 ENCOUNTER — Encounter: Payer: Medicaid Other | Admitting: Obstetrics & Gynecology

## 2012-08-26 ENCOUNTER — Emergency Department (HOSPITAL_COMMUNITY)
Admission: EM | Admit: 2012-08-26 | Discharge: 2012-08-26 | Disposition: A | Discharge status: Home / Self Care | Payer: Medicaid Other | Attending: Emergency Medicine | Admitting: Emergency Medicine

## 2012-08-26 ENCOUNTER — Encounter (HOSPITAL_COMMUNITY): Payer: Self-pay | Admitting: Emergency Medicine

## 2012-08-26 DIAGNOSIS — Y929 Unspecified place or not applicable: Secondary | ICD-10-CM | POA: Insufficient documentation

## 2012-08-26 DIAGNOSIS — F4310 Post-traumatic stress disorder, unspecified: Secondary | ICD-10-CM | POA: Insufficient documentation

## 2012-08-26 DIAGNOSIS — S3992XA Unspecified injury of lower back, initial encounter: Secondary | ICD-10-CM

## 2012-08-26 DIAGNOSIS — Z880 Allergy status to penicillin: Secondary | ICD-10-CM | POA: Insufficient documentation

## 2012-08-26 DIAGNOSIS — IMO0002 Reserved for concepts with insufficient information to code with codable children: Secondary | ICD-10-CM | POA: Insufficient documentation

## 2012-08-26 DIAGNOSIS — F329 Major depressive disorder, single episode, unspecified: Secondary | ICD-10-CM | POA: Insufficient documentation

## 2012-08-26 DIAGNOSIS — Y9301 Activity, walking, marching and hiking: Secondary | ICD-10-CM | POA: Insufficient documentation

## 2012-08-26 DIAGNOSIS — F17200 Nicotine dependence, unspecified, uncomplicated: Secondary | ICD-10-CM | POA: Insufficient documentation

## 2012-08-26 DIAGNOSIS — J45909 Unspecified asthma, uncomplicated: Secondary | ICD-10-CM | POA: Insufficient documentation

## 2012-08-26 DIAGNOSIS — Z79899 Other long term (current) drug therapy: Secondary | ICD-10-CM | POA: Insufficient documentation

## 2012-08-26 DIAGNOSIS — Z872 Personal history of diseases of the skin and subcutaneous tissue: Secondary | ICD-10-CM | POA: Insufficient documentation

## 2012-08-26 DIAGNOSIS — W010XXA Fall on same level from slipping, tripping and stumbling without subsequent striking against object, initial encounter: Secondary | ICD-10-CM | POA: Insufficient documentation

## 2012-08-26 MED ORDER — METHOCARBAMOL 500 MG PO TABS: 500.0000 mg | ORAL_TABLET | Freq: Two times a day (BID) | ORAL | Status: DC

## 2012-08-26 MED ORDER — ACETAMINOPHEN 500 MG PO TABS: 500.0000 mg | ORAL_TABLET | Freq: Four times a day (QID) | ORAL | Status: DC | PRN

## 2012-08-26 MED ORDER — OXYCODONE-ACETAMINOPHEN 5-325 MG PO TABS
1.0000 | ORAL_TABLET | Freq: Once | ORAL | Status: AC
  Administered 2012-08-26: 1 via ORAL
  Filled 2012-08-26: qty 1

## 2012-08-26 NOTE — ED Notes (Signed)
Pt presenting to ed with c/o falling this morning pt states she her cat tripped her and she fell. Pt denies loc pt states her cat did the same thing a couple weeks ago. Pt states she is waiting to get into pain management clinic pt states bilateral leg pain and buttock pain

## 2012-08-26 NOTE — ED Provider Notes (Signed)
Medical screening examination/treatment/procedure(s) were performed by non-physician practitioner and as supervising physician I was immediately available for consultation/collaboration.   Lounette Sloan E Elisia Stepp, MD 08/26/12 2043 

## 2012-08-26 NOTE — ED Provider Notes (Signed)
CSN: [id number]     Arrival date & time 08/26/12  1432 History  This chart was scribed for non-physician practitioner, Fayrene Helper, PA-C working with Claudean Kinds, MD by Greggory Stallion, ED scribe. This patient was seen in room WTR6/WTR6 and the patient's care was started at 3:04 PM.   Chief Complaint  Patient presents with  . Fall   Patient is a 47 y.o. female presenting with fall. The history is provided by the patient. No language interpreter was used.  Fall This is a new problem. The current episode started 6 to 12 hours ago. The problem has not changed since onset.Pertinent negatives include no chest pain, no abdominal pain, no headaches and no shortness of breath. The symptoms are aggravated by walking. Nothing relieves the symptoms. She has tried nothing for the symptoms.    HPI Comments: Rebekah Murillo is a 47 y.o. female with h/o chronic pain who presents to the Emergency Department complaining of sudden onset, constant shooting bilateral leg pain and shooting buttock pain that started this morning after she fell. She states walking makes the pain worse. Pt states she tripped over her cat this morning. Pt states she fell on a cement floor. She denies hitting her head or LOC. Pt did not take any medication for the pain before coming here. Pt denies numbness, urinary incontinence and bowel incontinence as associated symptoms. Pt is waiting to get into a pain management clinic on 8/18. Had history of recurrent falls with multiple prior ER visitations for pain related complaint.    Past Medical History  Diagnosis Date  . Major depression   . PTSD (post-traumatic stress disorder)   . Asthma   . Eczema    Past Surgical History  Procedure Laterality Date  . Appendectomy    . Bunionectomy      bilateral  . Orif ankle fracture Right 04/23/2012    Procedure: Right ankle fracture/non-union takedown, grafting, plating;  Surgeon: Cammy Copa, MD;  Location: [facility] OR;  Service:  Orthopedics;  Laterality: Right;  Right ankle fracture non-union takedown, grafting, plating    No family history on file. History  Substance Use Topics  . Smoking status: Current Every Day Smoker -- 1.50 packs/day for 31 years  . Smokeless tobacco: Not on file     Comment: Patient reports trying to cut back reports was smoking 2 packs a day  . Alcohol Use: No   OB History   Grav Para Term Preterm Abortions TAB SAB Ect Mult Living                 Review of Systems  Respiratory: Negative for shortness of breath.   Cardiovascular: Negative for chest pain.  Gastrointestinal: Negative for abdominal pain.  Musculoskeletal: Positive for myalgias.  Neurological: Negative for headaches.  All other systems reviewed and are negative.    Allergies  Codeine; Naproxen; Penicillins; and Tramadol  Home Medications   Current Outpatient Rx  Name  Route  Sig  Dispense  Refill  . acetaminophen (TYLENOL) 500 MG tablet   Oral   Take 1 tablet (500 mg total) by mouth every 6 (six) hours as needed for pain.   30 tablet   0   . gabapentin (NEURONTIN) 300 MG capsule   Oral   Take 2 capsules (600 mg total) by mouth 3 (three) times daily.   180 capsule   3   . HYDROcodone-acetaminophen (HYCET) 7.5-325 mg/15 ml solution   Oral   Take 10 mLs by  mouth every 8 (eight) hours as needed for pain.   60 mL   0   . methocarbamol (ROBAXIN) 500 MG tablet   Oral   Take 1 tablet (500 mg total) by mouth 2 (two) times daily.   20 tablet   0   . ramipril (ALTACE) 2.5 MG capsule   Oral   Take 1 capsule (2.5 mg total) by mouth daily.   30 capsule   3    BP 155/68  Pulse 84  Temp(Src) 98.8 F (37.1 C) (Oral)  Resp 18  SpO2 97%  Physical Exam  Nursing note and vitals reviewed. Constitutional: She is oriented to person, place, and time. She appears well-developed and well-nourished. No distress.  HENT:  Head: Normocephalic and atraumatic.  Eyes: EOM are normal.  Neck: Neck supple. No  tracheal deviation present.  Cardiovascular: Normal rate.   Pulmonary/Chest: Effort normal. No respiratory distress.  Musculoskeletal: Normal range of motion.  Tenderness upon palpation to lumbar and para lumbar midline spinal tenderness without crepitance or step offs. Ambulate with caine without difficulty.   Neurological: She is alert and oriented to person, place, and time.  Skin: Skin is warm and dry.  Psychiatric: She has a normal mood and affect. Her behavior is normal.    ED Course   Procedures (including critical care time)  DIAGNOSTIC STUDIES: Oxygen Saturation is 97% on RA, normal by my interpretation.    COORDINATION OF CARE: 3:14 PM-Discussed treatment plan which includes pain medication in the ED with pt at bedside and pt agreed to plan.   3:25 PM Pt able to ambulate.  Has been seen multiple times for fall.  Is currently awaits Pain Clinic management. I do not suspect bony fx.  I recommend OTC pain meds, RICE therapy and return if worsen.    Labs Reviewed - No data to display No results found. 1. Lower back injury, initial encounter     MDM  BP 155/68  Pulse 84  Temp(Src) 98.8 F (37.1 C) (Oral)  Resp 18  SpO2 97%    I personally performed the services described in this documentation, which was scribed in my presence. The recorded information has been reviewed and is accurate.    Fayrene Helper, PA-C 08/26/12 1527

## 2012-09-09 ENCOUNTER — Encounter: Payer: Medicaid Other | Attending: Physical Medicine & Rehabilitation

## 2012-09-09 ENCOUNTER — Encounter: Payer: Self-pay | Admitting: Physical Medicine & Rehabilitation

## 2012-09-09 ENCOUNTER — Ambulatory Visit (HOSPITAL_BASED_OUTPATIENT_CLINIC_OR_DEPARTMENT_OTHER): Payer: Medicaid Other | Admitting: Physical Medicine & Rehabilitation

## 2012-09-09 VITALS — BP 136/71 | HR 105 | Resp 14 | Ht 66.0 in | Wt 193.0 lb

## 2012-09-09 DIAGNOSIS — G894 Chronic pain syndrome: Secondary | ICD-10-CM

## 2012-09-09 DIAGNOSIS — G8929 Other chronic pain: Secondary | ICD-10-CM | POA: Insufficient documentation

## 2012-09-09 DIAGNOSIS — IMO0001 Reserved for inherently not codable concepts without codable children: Secondary | ICD-10-CM
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
## 2012-09-16 ENCOUNTER — Encounter: Payer: Self-pay | Admitting: Physical Medicine and Rehabilitation

## 2012-09-28 ENCOUNTER — Emergency Department (HOSPITAL_COMMUNITY)
Admission: EM | Admit: 2012-09-28 | Discharge: 2012-09-28 | Disposition: A | Discharge status: Home / Self Care | Payer: Medicaid Other | Attending: Emergency Medicine | Admitting: Emergency Medicine

## 2012-09-28 ENCOUNTER — Encounter (HOSPITAL_COMMUNITY): Payer: Self-pay | Admitting: Emergency Medicine

## 2012-09-28 DIAGNOSIS — F4310 Post-traumatic stress disorder, unspecified: Secondary | ICD-10-CM | POA: Insufficient documentation

## 2012-09-28 DIAGNOSIS — F329 Major depressive disorder, single episode, unspecified: Secondary | ICD-10-CM | POA: Insufficient documentation

## 2012-09-28 DIAGNOSIS — L989 Disorder of the skin and subcutaneous tissue, unspecified: Secondary | ICD-10-CM

## 2012-09-28 DIAGNOSIS — M25579 Pain in unspecified ankle and joints of unspecified foot: Secondary | ICD-10-CM | POA: Insufficient documentation

## 2012-09-28 DIAGNOSIS — Z79899 Other long term (current) drug therapy: Secondary | ICD-10-CM | POA: Insufficient documentation

## 2012-09-28 DIAGNOSIS — F17200 Nicotine dependence, unspecified, uncomplicated: Secondary | ICD-10-CM | POA: Insufficient documentation

## 2012-09-28 DIAGNOSIS — L988 Other specified disorders of the skin and subcutaneous tissue: Secondary | ICD-10-CM | POA: Insufficient documentation

## 2012-09-28 DIAGNOSIS — G8929 Other chronic pain: Secondary | ICD-10-CM | POA: Insufficient documentation

## 2012-09-28 DIAGNOSIS — Z880 Allergy status to penicillin: Secondary | ICD-10-CM | POA: Insufficient documentation

## 2012-09-28 DIAGNOSIS — J45909 Unspecified asthma, uncomplicated: Secondary | ICD-10-CM | POA: Insufficient documentation

## 2012-09-28 DIAGNOSIS — Z9889 Other specified postprocedural states: Secondary | ICD-10-CM | POA: Insufficient documentation

## 2012-09-28 MED ORDER — ACETAMINOPHEN 325 MG PO TABS
650.0000 mg | ORAL_TABLET | ORAL | Status: AC
  Administered 2012-09-28: 650 mg via ORAL
  Filled 2012-09-28: qty 2

## 2012-09-28 MED ORDER — SELENIUM SULFIDE 1 % EX LOTN: 1.0000 "application " | TOPICAL_LOTION | Freq: Every day | CUTANEOUS | Status: DC

## 2012-09-28 NOTE — ED Notes (Signed)
Per EMS: Pt c/o lt foot pain x 3 years since foot surgery.  Has been seen several times for this.  Pt has either bed bugs or scabies.  EMS reports "something is eating her scalp".

## 2012-09-28 NOTE — ED Notes (Signed)
Bed: [facility]2 Expected date:  Expected time:  Means of arrival:  Comments: EMS 
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
## 2012-09-29 NOTE — ED Provider Notes (Signed)
Medical screening examination/treatment/procedure(s) were performed by non-physician practitioner and as supervising physician I was immediately available for consultation/collaboration.     Celene Kras, MD 09/29/12 0000

## 2012-10-10 ENCOUNTER — Telehealth: Payer: Self-pay

## 2012-10-10 NOTE — Telephone Encounter (Signed)
Left message on Mr Lyla Son number for patient to call office regarding urine drug screen results.

## 2012-10-10 NOTE — Telephone Encounter (Signed)
Message copied by Judd Gaudier on Thu Oct 10, 2012  9:05 AM ------      Message from: Su Monks      Created: Wed Oct 09, 2012  1:40 PM       Please ask patient whether she has an explanation, for the tramadol which showed up in her UDS, in her med chart it is not stated that she takes that med. ------

## 2012-11-28 ENCOUNTER — Other Ambulatory Visit: Payer: Self-pay

## 2012-12-04 ENCOUNTER — Ambulatory Visit: Payer: Medicaid Other | Admitting: Internal Medicine

## 2013-01-03 ENCOUNTER — Other Ambulatory Visit: Payer: Medicaid Other

## 2013-01-24 DIAGNOSIS — IMO0002 Reserved for concepts with insufficient information to code with codable children: Secondary | ICD-10-CM | POA: Diagnosis not present

## 2013-01-24 DIAGNOSIS — M531 Cervicobrachial syndrome: Secondary | ICD-10-CM | POA: Diagnosis not present

## 2013-01-24 DIAGNOSIS — M999 Biomechanical lesion, unspecified: Secondary | ICD-10-CM | POA: Diagnosis not present

## 2013-01-24 DIAGNOSIS — M9981 Other biomechanical lesions of cervical region: Secondary | ICD-10-CM | POA: Diagnosis not present

## 2013-01-24 DIAGNOSIS — M5137 Other intervertebral disc degeneration, lumbosacral region: Secondary | ICD-10-CM | POA: Diagnosis not present

## 2013-02-06 DIAGNOSIS — M545 Low back pain, unspecified: Secondary | ICD-10-CM | POA: Diagnosis not present

## 2013-02-06 DIAGNOSIS — M533 Sacrococcygeal disorders, not elsewhere classified: Secondary | ICD-10-CM | POA: Diagnosis not present

## 2013-02-06 DIAGNOSIS — M5430 Sciatica, unspecified side: Secondary | ICD-10-CM | POA: Diagnosis not present

## 2013-02-12 DIAGNOSIS — F411 Generalized anxiety disorder: Secondary | ICD-10-CM | POA: Diagnosis not present

## 2013-03-06 DIAGNOSIS — M5430 Sciatica, unspecified side: Secondary | ICD-10-CM | POA: Diagnosis not present

## 2013-03-06 DIAGNOSIS — M545 Low back pain, unspecified: Secondary | ICD-10-CM | POA: Diagnosis not present

## 2013-03-06 DIAGNOSIS — M533 Sacrococcygeal disorders, not elsewhere classified: Secondary | ICD-10-CM | POA: Diagnosis not present

## 2013-04-03 DIAGNOSIS — R42 Dizziness and giddiness: Secondary | ICD-10-CM | POA: Diagnosis not present

## 2013-04-03 DIAGNOSIS — M545 Low back pain, unspecified: Secondary | ICD-10-CM | POA: Diagnosis not present

## 2013-04-03 DIAGNOSIS — Z79899 Other long term (current) drug therapy: Secondary | ICD-10-CM | POA: Diagnosis not present

## 2013-04-03 DIAGNOSIS — M533 Sacrococcygeal disorders, not elsewhere classified: Secondary | ICD-10-CM | POA: Diagnosis not present

## 2013-04-03 DIAGNOSIS — M5430 Sciatica, unspecified side: Secondary | ICD-10-CM | POA: Diagnosis not present

## 2013-04-03 DIAGNOSIS — G9009 Other idiopathic peripheral autonomic neuropathy: Secondary | ICD-10-CM | POA: Diagnosis not present

## 2013-04-17 DIAGNOSIS — F411 Generalized anxiety disorder: Secondary | ICD-10-CM | POA: Diagnosis not present

## 2013-04-17 DIAGNOSIS — F4310 Post-traumatic stress disorder, unspecified: Secondary | ICD-10-CM | POA: Diagnosis not present

## 2013-05-01 DIAGNOSIS — M545 Low back pain, unspecified: Secondary | ICD-10-CM | POA: Diagnosis not present

## 2013-05-01 DIAGNOSIS — M5412 Radiculopathy, cervical region: Secondary | ICD-10-CM | POA: Diagnosis not present

## 2013-05-01 DIAGNOSIS — M533 Sacrococcygeal disorders, not elsewhere classified: Secondary | ICD-10-CM | POA: Diagnosis not present

## 2013-05-01 DIAGNOSIS — M5430 Sciatica, unspecified side: Secondary | ICD-10-CM | POA: Diagnosis not present

## 2013-05-01 DIAGNOSIS — Z79899 Other long term (current) drug therapy: Secondary | ICD-10-CM | POA: Diagnosis not present

## 2013-05-01 DIAGNOSIS — M542 Cervicalgia: Secondary | ICD-10-CM | POA: Diagnosis not present

## 2013-05-20 DIAGNOSIS — M549 Dorsalgia, unspecified: Secondary | ICD-10-CM | POA: Diagnosis not present

## 2013-05-20 DIAGNOSIS — E7800 Pure hypercholesterolemia, unspecified: Secondary | ICD-10-CM | POA: Diagnosis not present

## 2013-05-20 DIAGNOSIS — J449 Chronic obstructive pulmonary disease, unspecified: Secondary | ICD-10-CM | POA: Diagnosis not present

## 2013-05-20 DIAGNOSIS — R5381 Other malaise: Secondary | ICD-10-CM | POA: Diagnosis not present

## 2013-05-20 DIAGNOSIS — Z833 Family history of diabetes mellitus: Secondary | ICD-10-CM | POA: Diagnosis not present

## 2013-05-20 DIAGNOSIS — G4700 Insomnia, unspecified: Secondary | ICD-10-CM | POA: Diagnosis not present

## 2013-05-20 DIAGNOSIS — I10 Essential (primary) hypertension: Secondary | ICD-10-CM | POA: Diagnosis not present

## 2013-05-20 DIAGNOSIS — F17200 Nicotine dependence, unspecified, uncomplicated: Secondary | ICD-10-CM | POA: Diagnosis not present

## 2013-05-20 DIAGNOSIS — R5383 Other fatigue: Secondary | ICD-10-CM | POA: Diagnosis not present

## 2013-05-20 DIAGNOSIS — J029 Acute pharyngitis, unspecified: Secondary | ICD-10-CM | POA: Diagnosis not present

## 2013-05-20 DIAGNOSIS — E119 Type 2 diabetes mellitus without complications: Secondary | ICD-10-CM | POA: Diagnosis not present

## 2013-05-20 DIAGNOSIS — F411 Generalized anxiety disorder: Secondary | ICD-10-CM | POA: Diagnosis not present

## 2013-05-28 DIAGNOSIS — F449 Dissociative and conversion disorder, unspecified: Secondary | ICD-10-CM | POA: Diagnosis not present

## 2013-05-29 DIAGNOSIS — Z79899 Other long term (current) drug therapy: Secondary | ICD-10-CM | POA: Diagnosis not present

## 2013-05-29 DIAGNOSIS — M545 Low back pain, unspecified: Secondary | ICD-10-CM | POA: Diagnosis not present

## 2013-05-29 DIAGNOSIS — M533 Sacrococcygeal disorders, not elsewhere classified: Secondary | ICD-10-CM | POA: Diagnosis not present

## 2013-05-29 DIAGNOSIS — M79609 Pain in unspecified limb: Secondary | ICD-10-CM | POA: Diagnosis not present

## 2013-05-29 DIAGNOSIS — M5430 Sciatica, unspecified side: Secondary | ICD-10-CM | POA: Diagnosis not present

## 2013-05-29 DIAGNOSIS — M542 Cervicalgia: Secondary | ICD-10-CM | POA: Diagnosis not present

## 2013-05-29 DIAGNOSIS — G894 Chronic pain syndrome: Secondary | ICD-10-CM | POA: Diagnosis not present

## 2013-05-29 DIAGNOSIS — M5412 Radiculopathy, cervical region: Secondary | ICD-10-CM | POA: Diagnosis not present

## 2013-06-24 DIAGNOSIS — M545 Low back pain, unspecified: Secondary | ICD-10-CM | POA: Diagnosis not present

## 2013-06-24 DIAGNOSIS — M533 Sacrococcygeal disorders, not elsewhere classified: Secondary | ICD-10-CM | POA: Diagnosis not present

## 2013-06-24 DIAGNOSIS — G894 Chronic pain syndrome: Secondary | ICD-10-CM | POA: Diagnosis not present

## 2013-06-24 DIAGNOSIS — Z79899 Other long term (current) drug therapy: Secondary | ICD-10-CM | POA: Diagnosis not present

## 2013-06-24 DIAGNOSIS — M542 Cervicalgia: Secondary | ICD-10-CM | POA: Diagnosis not present

## 2013-06-24 DIAGNOSIS — M5430 Sciatica, unspecified side: Secondary | ICD-10-CM | POA: Diagnosis not present

## 2013-07-24 DIAGNOSIS — M542 Cervicalgia: Secondary | ICD-10-CM | POA: Diagnosis not present

## 2013-07-24 DIAGNOSIS — M533 Sacrococcygeal disorders, not elsewhere classified: Secondary | ICD-10-CM | POA: Diagnosis not present

## 2013-07-24 DIAGNOSIS — Z79899 Other long term (current) drug therapy: Secondary | ICD-10-CM | POA: Diagnosis not present

## 2013-07-24 DIAGNOSIS — M79609 Pain in unspecified limb: Secondary | ICD-10-CM | POA: Diagnosis not present

## 2013-07-24 DIAGNOSIS — M5412 Radiculopathy, cervical region: Secondary | ICD-10-CM | POA: Diagnosis not present

## 2013-07-24 DIAGNOSIS — M545 Low back pain, unspecified: Secondary | ICD-10-CM | POA: Diagnosis not present

## 2013-07-24 DIAGNOSIS — G894 Chronic pain syndrome: Secondary | ICD-10-CM | POA: Diagnosis not present

## 2013-07-24 DIAGNOSIS — M5430 Sciatica, unspecified side: Secondary | ICD-10-CM | POA: Diagnosis not present

## 2013-08-20 DIAGNOSIS — M545 Low back pain, unspecified: Secondary | ICD-10-CM | POA: Diagnosis not present

## 2013-08-20 DIAGNOSIS — M5412 Radiculopathy, cervical region: Secondary | ICD-10-CM | POA: Diagnosis not present

## 2013-08-20 DIAGNOSIS — M542 Cervicalgia: Secondary | ICD-10-CM | POA: Diagnosis not present

## 2013-08-20 DIAGNOSIS — M5430 Sciatica, unspecified side: Secondary | ICD-10-CM | POA: Diagnosis not present

## 2013-08-20 DIAGNOSIS — M533 Sacrococcygeal disorders, not elsewhere classified: Secondary | ICD-10-CM | POA: Diagnosis not present

## 2013-08-28 DIAGNOSIS — F4310 Post-traumatic stress disorder, unspecified: Secondary | ICD-10-CM | POA: Diagnosis not present

## 2013-10-09 DIAGNOSIS — F4310 Post-traumatic stress disorder, unspecified: Secondary | ICD-10-CM | POA: Diagnosis not present

## 2013-10-27 DIAGNOSIS — G8921 Chronic pain due to trauma: Secondary | ICD-10-CM | POA: Diagnosis not present

## 2013-10-27 DIAGNOSIS — G4700 Insomnia, unspecified: Secondary | ICD-10-CM | POA: Diagnosis not present

## 2013-10-27 DIAGNOSIS — I10 Essential (primary) hypertension: Secondary | ICD-10-CM | POA: Diagnosis not present

## 2013-10-27 DIAGNOSIS — F419 Anxiety disorder, unspecified: Secondary | ICD-10-CM | POA: Diagnosis not present

## 2014-04-25 IMAGING — CR DG ANKLE PORT 2V*R*
2 series · 2 of 2 positions shown · non-contrast
Comparison: 04/23/2012, 03/27/2012

CLINICAL DATA: Postop ankle fracture, ORIF

PORTABLE RIGHT ANKLE - 2 VIEW

[AP]
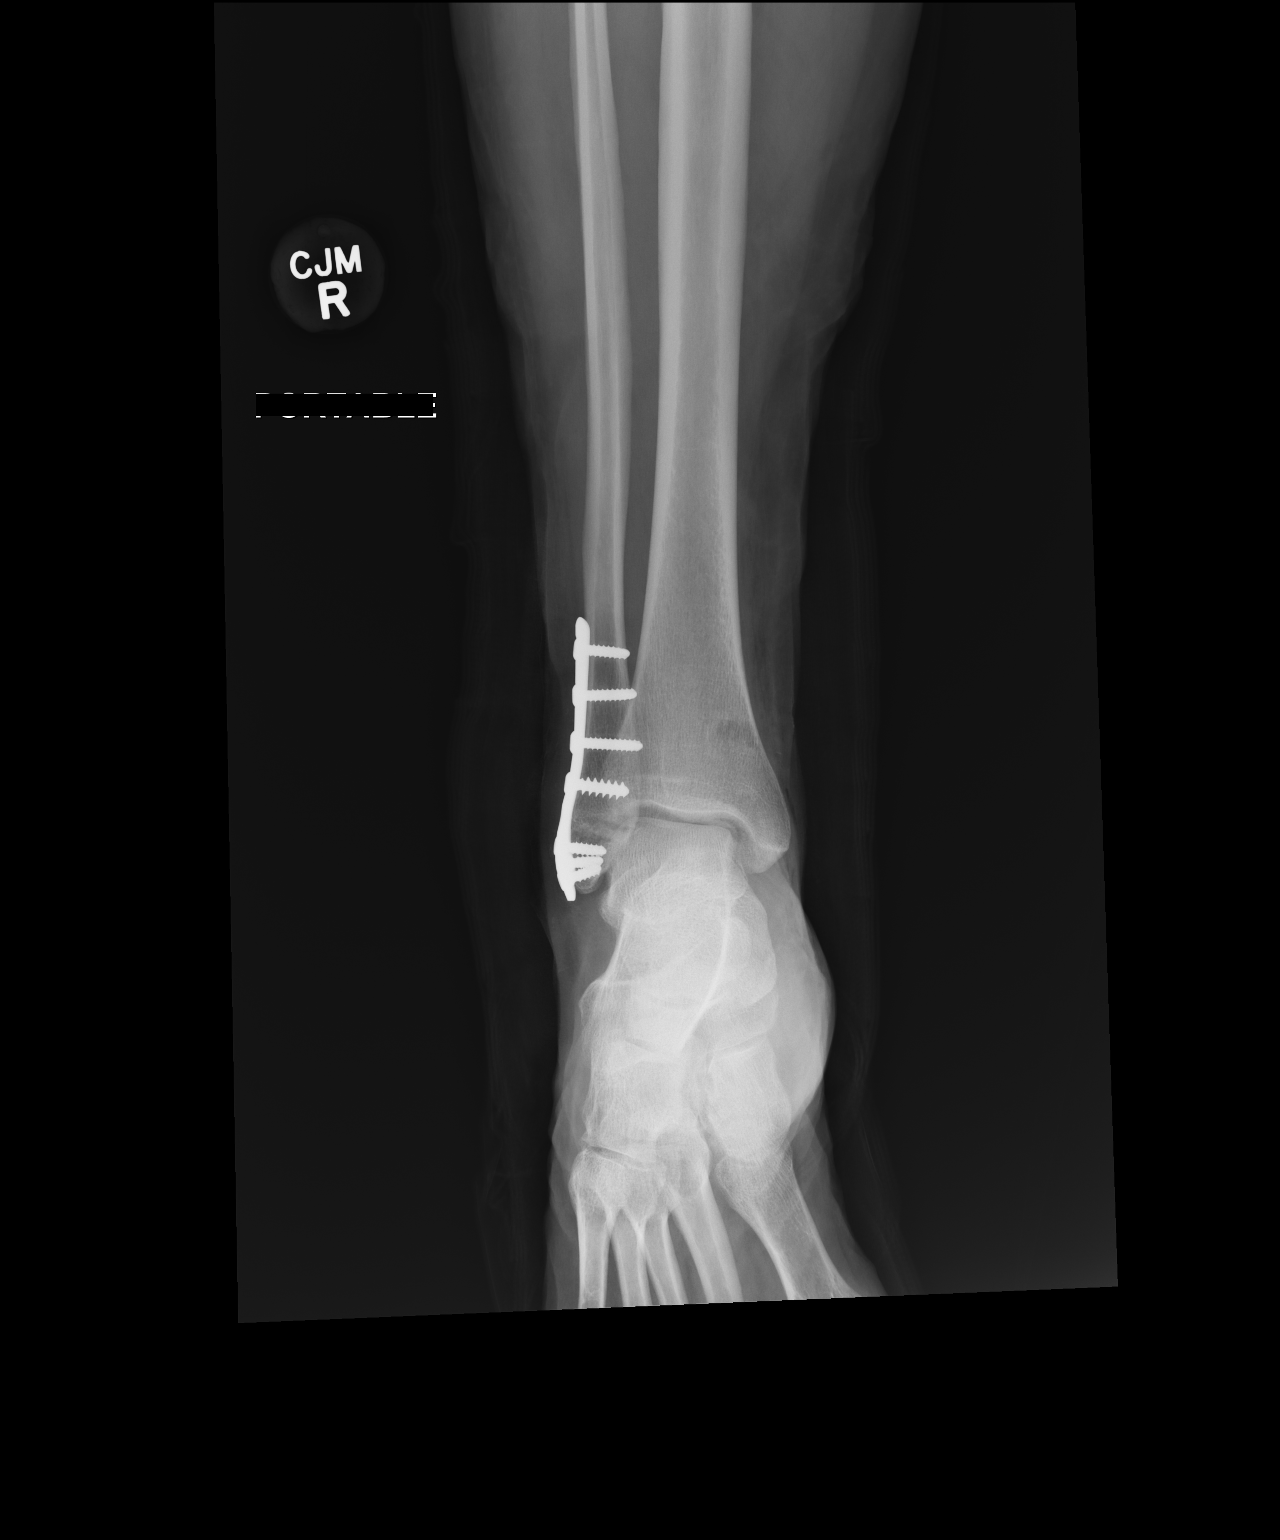

[lateral]
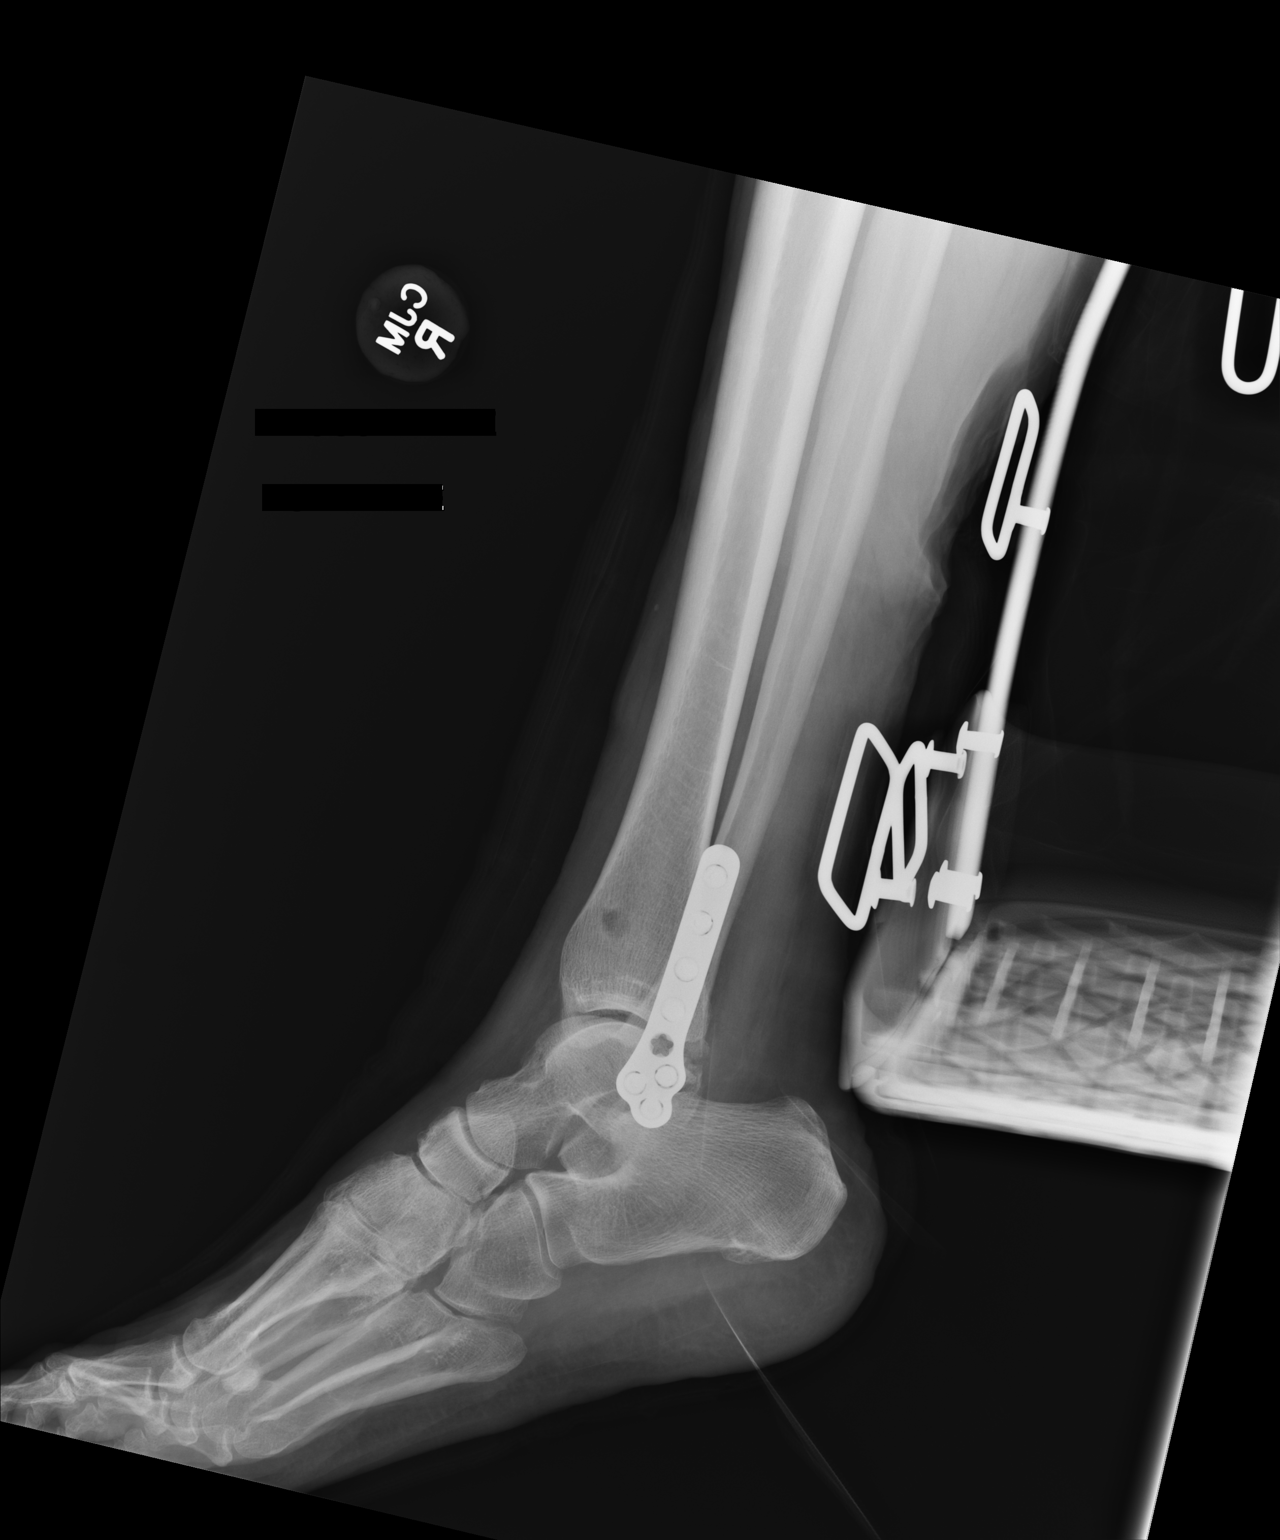

[2 of 2 positions shown; findings below may reference images not displayed]

FINDINGS: Plate screw fixation has been performed of the right
ankle lateral malleolar fracture.  Anatomic alignment.  Lucency in
the distal tibia is presumed from a bone harvest site.  No acute
osseous finding or hardware abnormality.
IMPRESSION: Expected appearance status post right ankle lateral malleolar ORIF.
Anatomic alignment.  No complicating feature.

## 2014-04-25 IMAGING — CT CT CHEST W/O CM
1 of 2 series · 15 of 32 positions shown, 19 images · non-contrast
Comparison: Chest x-ray 04/19/2012

CLINICAL DATA: Smoker.  Follow-up nodule on preoperative chest x-
ray.

CT CHEST WITHOUT CONTRAST
TECHNIQUE: Multidetector CT imaging of the chest was performed
following the standard protocol without IV contrast.

[Series 2: routine chest 5.0 st · axial · 0.62mm/px · z∈[-379,-54]mm · 15 of 76 slices shown, 19 images]
[im 6/76  mediastinal]
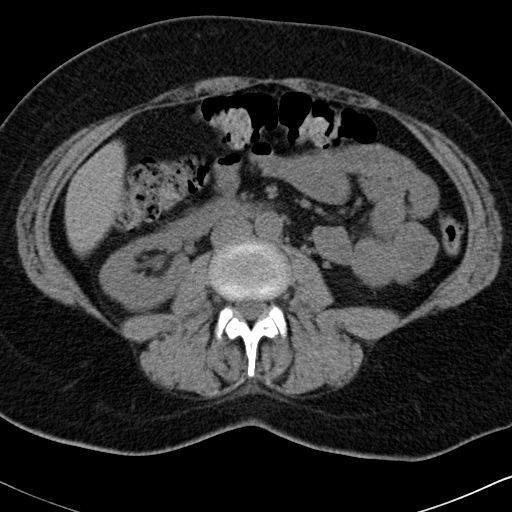
[im 6/76  lung]
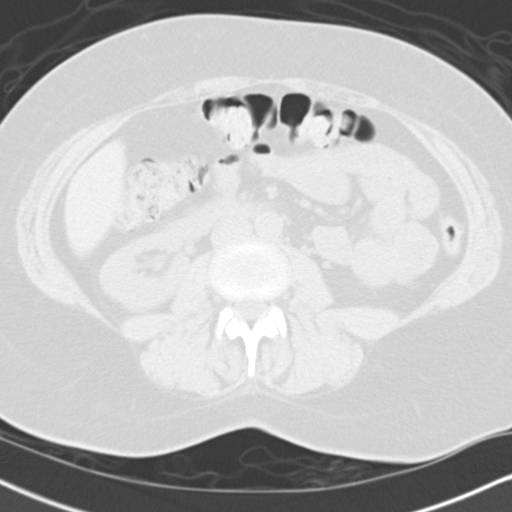
[im 11/76  lung]
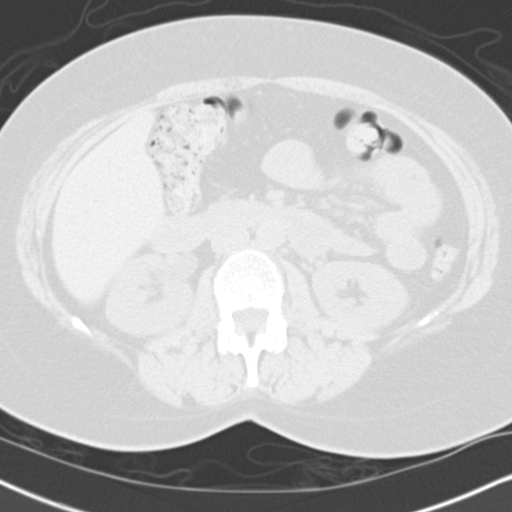
[im 16/76  lung]
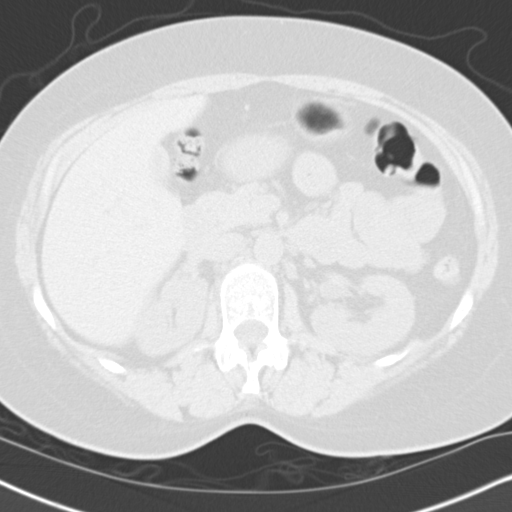
[im 21/76  lung]
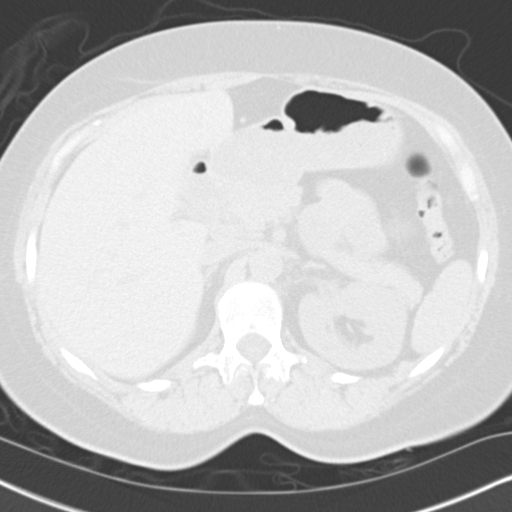
[im 26/76  mediastinal]
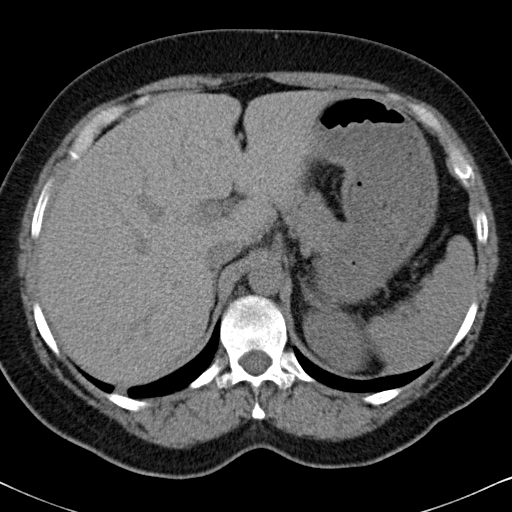
[im 26/76  lung]
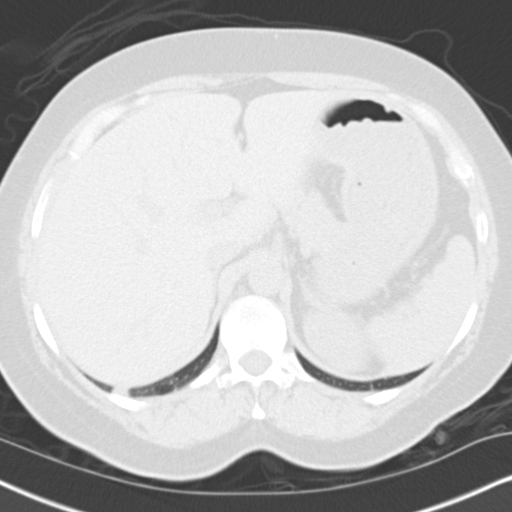
[im 31/76  lung]
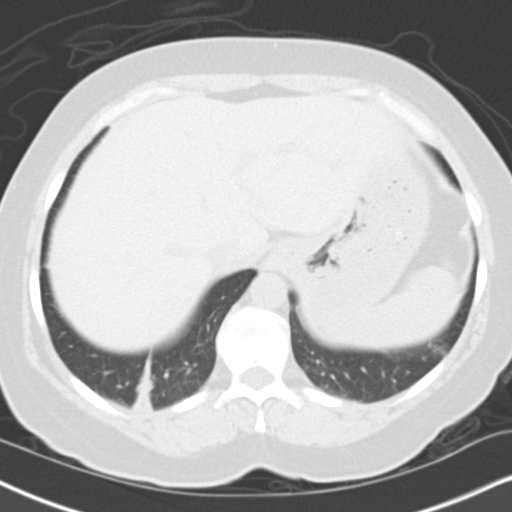
[im 36/76  lung]
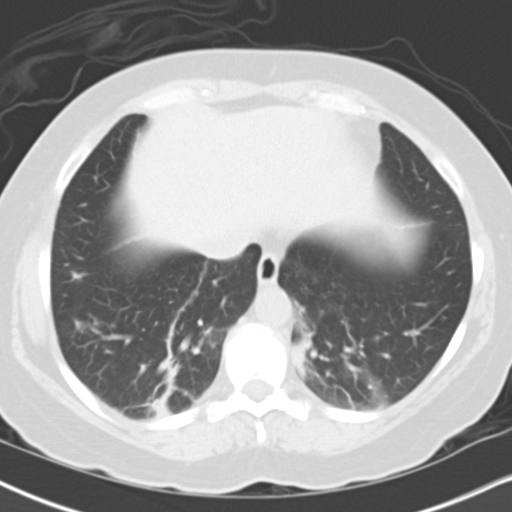
[im 38/76  lung]
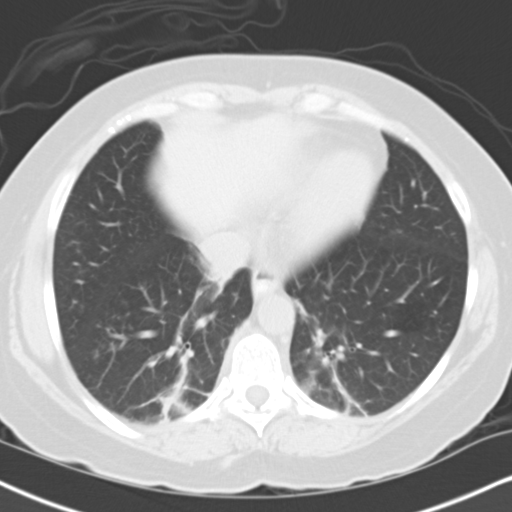
[im 41/76  mediastinal]
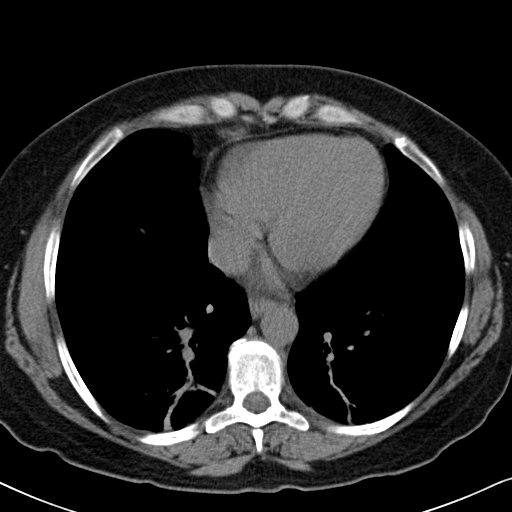
[im 41/76  lung]
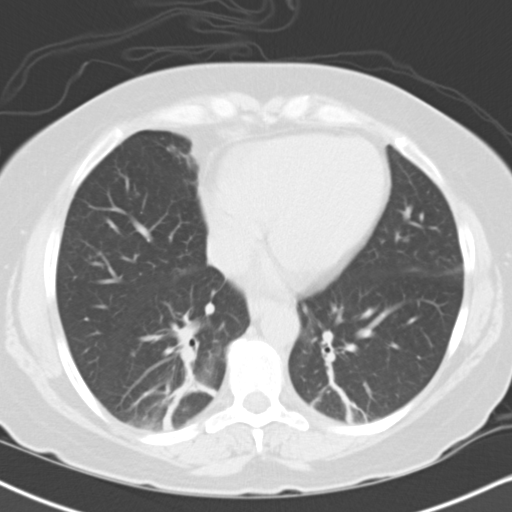
[im 46/76  lung]
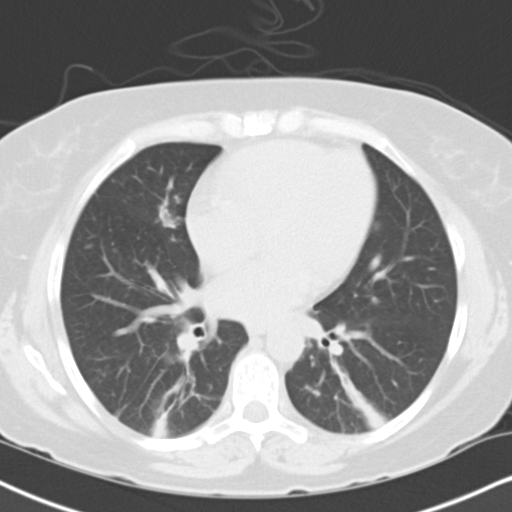
[im 51/76  lung]
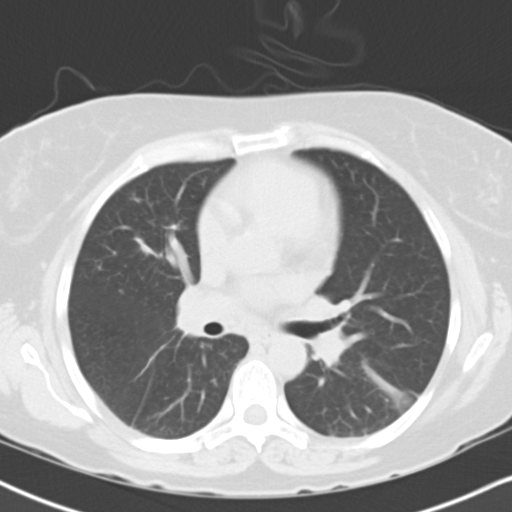
[im 56/76  lung]
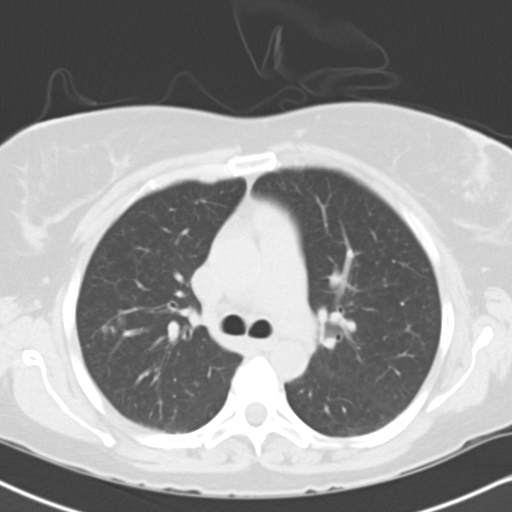
[im 61/76  mediastinal]
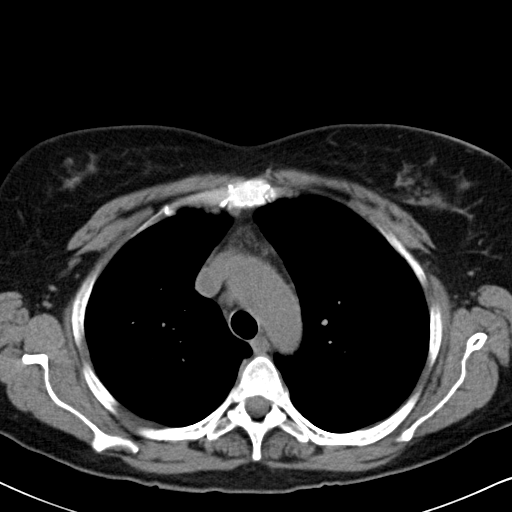
[im 61/76  lung]
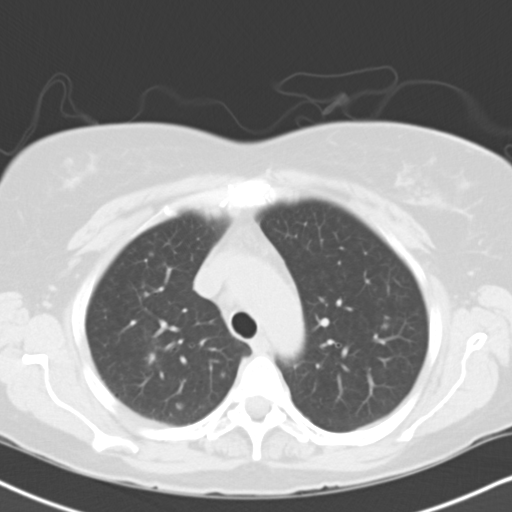
[im 66/76  lung]
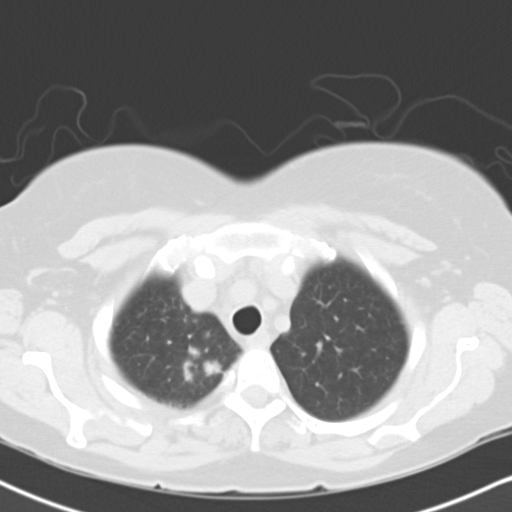
[im 71/76  lung]
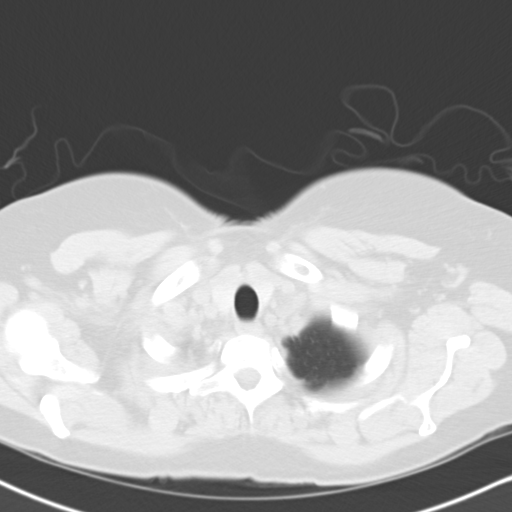

[15 of 32 positions shown; findings below may reference images not displayed]

FINDINGS: Examination is obtained without IV contrast material due
to no IV access.

There are focal areas of linear atelectasis in the lung bases
bilaterally.  There are patchy focal nodular and confluent areas of
ground-glass opacities distributed throughout the right lung.
Given the history recent surgery, this could represent atelectasis
or inflammatory process.  Multifocal pneumonia is not excluded.
The nodule demonstrated on chest x-ray is not definitively
identified although the atelectasis and ground-glass changes could
account for the nodule or could obscure the nodule.  A followup
study in 3 months is recommended for further evaluation.

Normal heart size.  Normal caliber thoracic aorta with mild
calcification.  Small calcified lymph nodes in the mediastinum.  No
evidence of significant lymphadenopathy in the chest.  The
esophagus is decompressed.  No pneumothorax.  No pleural effusions.
Degenerative changes in the thoracic spine.
IMPRESSION: Linear atelectasis in both lung bases.  Patchy focal and confluent
areas of ground-glass nodularity scattered throughout the right
lung.  These likely represent atelectasis or inflammatory process.
3-month follow-up study is recommended for further evaluation.  The
nodule identified on chest radiograph is not specifically
identified although it could be caused by or obscured by the other
parenchymal process.

## 2014-05-01 DIAGNOSIS — G894 Chronic pain syndrome: Secondary | ICD-10-CM | POA: Diagnosis not present

## 2014-05-01 DIAGNOSIS — Z79891 Long term (current) use of opiate analgesic: Secondary | ICD-10-CM | POA: Diagnosis not present

## 2014-05-01 DIAGNOSIS — I10 Essential (primary) hypertension: Secondary | ICD-10-CM | POA: Diagnosis not present

## 2014-05-01 DIAGNOSIS — Z5181 Encounter for therapeutic drug level monitoring: Secondary | ICD-10-CM | POA: Diagnosis not present

## 2014-05-05 DIAGNOSIS — Z23 Encounter for immunization: Secondary | ICD-10-CM | POA: Diagnosis not present

## 2014-05-06 DIAGNOSIS — Z1231 Encounter for screening mammogram for malignant neoplasm of breast: Secondary | ICD-10-CM | POA: Diagnosis not present

## 2014-05-22 DIAGNOSIS — F3132 Bipolar disorder, current episode depressed, moderate: Secondary | ICD-10-CM | POA: Diagnosis not present

## 2014-06-01 DIAGNOSIS — I10 Essential (primary) hypertension: Secondary | ICD-10-CM | POA: Diagnosis not present

## 2014-06-01 DIAGNOSIS — F419 Anxiety disorder, unspecified: Secondary | ICD-10-CM | POA: Diagnosis not present

## 2014-06-01 DIAGNOSIS — G894 Chronic pain syndrome: Secondary | ICD-10-CM | POA: Diagnosis not present

## 2014-06-01 DIAGNOSIS — R600 Localized edema: Secondary | ICD-10-CM | POA: Diagnosis not present

## 2014-06-12 DIAGNOSIS — M25511 Pain in right shoulder: Secondary | ICD-10-CM | POA: Diagnosis not present

## 2014-06-12 DIAGNOSIS — M25571 Pain in right ankle and joints of right foot: Secondary | ICD-10-CM | POA: Diagnosis not present

## 2014-06-12 DIAGNOSIS — M545 Low back pain: Secondary | ICD-10-CM | POA: Diagnosis not present

## 2014-06-12 DIAGNOSIS — Z79891 Long term (current) use of opiate analgesic: Secondary | ICD-10-CM | POA: Diagnosis not present

## 2014-06-12 DIAGNOSIS — G8929 Other chronic pain: Secondary | ICD-10-CM | POA: Diagnosis not present

## 2014-06-19 DIAGNOSIS — F4310 Post-traumatic stress disorder, unspecified: Secondary | ICD-10-CM | POA: Diagnosis not present

## 2014-06-19 DIAGNOSIS — F3132 Bipolar disorder, current episode depressed, moderate: Secondary | ICD-10-CM | POA: Diagnosis not present

## 2014-06-24 DIAGNOSIS — F4310 Post-traumatic stress disorder, unspecified: Secondary | ICD-10-CM | POA: Diagnosis not present

## 2014-06-24 DIAGNOSIS — F3132 Bipolar disorder, current episode depressed, moderate: Secondary | ICD-10-CM | POA: Diagnosis not present

## 2014-06-30 DIAGNOSIS — F4310 Post-traumatic stress disorder, unspecified: Secondary | ICD-10-CM | POA: Diagnosis not present

## 2014-06-30 DIAGNOSIS — F3132 Bipolar disorder, current episode depressed, moderate: Secondary | ICD-10-CM | POA: Diagnosis not present

## 2014-07-10 DIAGNOSIS — G894 Chronic pain syndrome: Secondary | ICD-10-CM | POA: Diagnosis not present

## 2014-07-10 DIAGNOSIS — F419 Anxiety disorder, unspecified: Secondary | ICD-10-CM | POA: Diagnosis not present

## 2014-07-10 DIAGNOSIS — G4700 Insomnia, unspecified: Secondary | ICD-10-CM | POA: Diagnosis not present

## 2014-07-10 DIAGNOSIS — R5382 Chronic fatigue, unspecified: Secondary | ICD-10-CM | POA: Diagnosis not present

## 2014-07-15 DIAGNOSIS — F4310 Post-traumatic stress disorder, unspecified: Secondary | ICD-10-CM | POA: Diagnosis not present

## 2014-07-15 DIAGNOSIS — F3132 Bipolar disorder, current episode depressed, moderate: Secondary | ICD-10-CM | POA: Diagnosis not present

## 2014-08-04 DIAGNOSIS — F4310 Post-traumatic stress disorder, unspecified: Secondary | ICD-10-CM | POA: Diagnosis not present

## 2014-08-04 DIAGNOSIS — F3132 Bipolar disorder, current episode depressed, moderate: Secondary | ICD-10-CM | POA: Diagnosis not present

## 2014-08-11 DIAGNOSIS — F3132 Bipolar disorder, current episode depressed, moderate: Secondary | ICD-10-CM | POA: Diagnosis not present

## 2014-08-11 DIAGNOSIS — F4310 Post-traumatic stress disorder, unspecified: Secondary | ICD-10-CM | POA: Diagnosis not present

## 2014-09-08 DIAGNOSIS — F4310 Post-traumatic stress disorder, unspecified: Secondary | ICD-10-CM | POA: Diagnosis not present

## 2014-09-10 DIAGNOSIS — M545 Low back pain: Secondary | ICD-10-CM | POA: Diagnosis not present

## 2014-09-10 DIAGNOSIS — G8929 Other chronic pain: Secondary | ICD-10-CM | POA: Diagnosis not present

## 2014-10-08 DIAGNOSIS — M25571 Pain in right ankle and joints of right foot: Secondary | ICD-10-CM | POA: Diagnosis not present

## 2014-10-08 DIAGNOSIS — M545 Low back pain: Secondary | ICD-10-CM | POA: Diagnosis not present

## 2014-10-08 DIAGNOSIS — G8929 Other chronic pain: Secondary | ICD-10-CM | POA: Diagnosis not present

## 2014-10-08 DIAGNOSIS — Z79891 Long term (current) use of opiate analgesic: Secondary | ICD-10-CM | POA: Diagnosis not present

## 2014-10-16 ENCOUNTER — Emergency Department (HOSPITAL_COMMUNITY)
Admission: EM | Admit: 2014-10-16 | Discharge: 2014-10-16 | Disposition: A | Discharge status: Home / Self Care | Payer: Medicare Other | Attending: Emergency Medicine | Admitting: Emergency Medicine

## 2014-10-16 ENCOUNTER — Encounter (HOSPITAL_COMMUNITY): Payer: Self-pay | Admitting: Emergency Medicine

## 2014-10-16 DIAGNOSIS — Z720 Tobacco use: Secondary | ICD-10-CM | POA: Insufficient documentation

## 2014-10-16 DIAGNOSIS — F4310 Post-traumatic stress disorder, unspecified: Secondary | ICD-10-CM | POA: Insufficient documentation

## 2014-10-16 DIAGNOSIS — R21 Rash and other nonspecific skin eruption: Secondary | ICD-10-CM | POA: Diagnosis present

## 2014-10-16 DIAGNOSIS — Z872 Personal history of diseases of the skin and subcutaneous tissue: Secondary | ICD-10-CM | POA: Insufficient documentation

## 2014-10-16 DIAGNOSIS — Z880 Allergy status to penicillin: Secondary | ICD-10-CM | POA: Diagnosis not present

## 2014-10-16 DIAGNOSIS — R200 Anesthesia of skin: Secondary | ICD-10-CM | POA: Insufficient documentation

## 2014-10-16 DIAGNOSIS — I10 Essential (primary) hypertension: Secondary | ICD-10-CM | POA: Diagnosis not present

## 2014-10-16 DIAGNOSIS — F322 Major depressive disorder, single episode, severe without psychotic features: Secondary | ICD-10-CM | POA: Diagnosis not present

## 2014-10-16 DIAGNOSIS — Z79899 Other long term (current) drug therapy: Secondary | ICD-10-CM | POA: Insufficient documentation

## 2014-10-16 DIAGNOSIS — A6000 Herpesviral infection of urogenital system, unspecified: Secondary | ICD-10-CM

## 2014-10-16 DIAGNOSIS — R202 Paresthesia of skin: Secondary | ICD-10-CM | POA: Diagnosis not present

## 2014-10-16 DIAGNOSIS — J45909 Unspecified asthma, uncomplicated: Secondary | ICD-10-CM | POA: Diagnosis not present

## 2014-10-16 DIAGNOSIS — I6789 Other cerebrovascular disease: Secondary | ICD-10-CM | POA: Diagnosis not present

## 2014-10-16 LAB — I-STAT CHEM 8, ED
BUN: 7 mg/dL (ref 6–20)
Calcium, Ion: 1.13 mmol/L (ref 1.12–1.23)
Chloride: 106 mmol/L (ref 101–111)
Creatinine, Ser: 0.8 mg/dL (ref 0.44–1.00)
Glucose, Bld: 119 mg/dL — ABNORMAL HIGH (ref 65–99)
HEMATOCRIT: 49 % — AB (ref 36.0–46.0)
HEMOGLOBIN: 16.7 g/dL — AB (ref 12.0–15.0)
POTASSIUM: 4.2 mmol/L (ref 3.5–5.1)
SODIUM: 141 mmol/L (ref 135–145)
TCO2: 22 mmol/L (ref 0–100)

## 2014-10-16 MED ORDER — ACYCLOVIR 400 MG PO TABS: 400.0000 mg | ORAL_TABLET | Freq: Four times a day (QID) | ORAL | Status: DC

## 2014-10-16 MED ORDER — RAMIPRIL 2.5 MG PO CAPS: 2.5000 mg | ORAL_CAPSULE | Freq: Every day | ORAL | Status: DC

## 2014-10-16 NOTE — Discharge Instructions (Signed)
Hypertension °Hypertension, commonly called high blood pressure, is when the force of blood pumping through your arteries is too strong. Your arteries are the blood vessels that carry blood from your heart throughout your body. A blood pressure reading consists of a higher number over a lower number, such as 110/72. The higher number (systolic) is the pressure inside your arteries when your heart pumps. The lower number (diastolic) is the pressure inside your arteries when your heart relaxes. Ideally you want your blood pressure below 120/80. °Hypertension forces your heart to work harder to pump blood. Your arteries may become narrow or stiff. Having hypertension puts you at risk for heart disease, stroke, and other problems.  °RISK FACTORS °Some risk factors for high blood pressure are controllable. Others are not.  °Risk factors you cannot control include:  °· Race. You may be at higher risk if you are African American. °· Age. Risk increases with age. °· Gender. Men are at higher risk than women before age 45 years. After age 65, women are at higher risk than men. °Risk factors you can control include: °· Not getting enough exercise or physical activity. °· Being overweight. °· Getting too much fat, sugar, calories, or salt in your diet. °· Drinking too much alcohol. °SIGNS AND SYMPTOMS °Hypertension does not usually cause signs or symptoms. Extremely high blood pressure (hypertensive crisis) may cause headache, anxiety, shortness of breath, and nosebleed. °DIAGNOSIS  °To check if you have hypertension, your health care provider will measure your blood pressure while you are seated, with your arm held at the level of your heart. It should be measured at least twice using the same arm. Certain conditions can cause a difference in blood pressure between your right and left arms. A blood pressure reading that is higher than normal on one occasion does not mean that you need treatment. If one blood pressure reading  is high, ask your health care provider about having it checked again. °TREATMENT  °Treating high blood pressure includes making lifestyle changes and possibly taking medicine. Living a healthy lifestyle can help lower high blood pressure. You may need to change some of your habits. °Lifestyle changes may include: °· Following the DASH diet. This diet is high in fruits, vegetables, and whole grains. It is low in salt, red meat, and added sugars. °· Getting at least 2½ hours of brisk physical activity every week. °· Losing weight if necessary. °· Not smoking. °· Limiting alcoholic beverages. °· Learning ways to reduce stress. ° If lifestyle changes are not enough to get your blood pressure under control, your health care provider may prescribe medicine. You may need to take more than one. Work closely with your health care provider to understand the risks and benefits. °HOME CARE INSTRUCTIONS °· Have your blood pressure rechecked as directed by your health care provider.   °· Take medicines only as directed by your health care provider. Follow the directions carefully. Blood pressure medicines must be taken as prescribed. The medicine does not work as well when you skip doses. Skipping doses also puts you at risk for problems.   °· Do not smoke.   °· Monitor your blood pressure at home as directed by your health care provider.  °SEEK MEDICAL CARE IF:  °· You think you are having a reaction to medicines taken. °· You have recurrent headaches or feel dizzy. °· You have swelling in your ankles. °· You have trouble with your vision. °SEEK IMMEDIATE MEDICAL CARE IF: °· You develop a severe headache or confusion. °·   You have unusual weakness, numbness, or feel faint.  You have severe chest or abdominal pain.  You vomit repeatedly.  You have trouble breathing. MAKE SURE YOU:   Understand these instructions.  Will watch your condition.  Will get help right away if you are not doing well or get worse. Document  Released: 01/09/2005 Document Revised: 05/26/2013 Document Reviewed: 11/01/2012 [facility] Patient Information 2015 [place], [place]. This information is not intended to replace advice given to you by your health care provider. Make sure you discuss any questions you have with your health care provider.  Genital Herpes Genital herpes is a sexually transmitted disease. This means that it is a disease passed by having sex with an infected person. There is no cure for genital herpes. The time between attacks can be months to years. The virus may live in a person but produce no problems (symptoms). This infection can be passed to a baby as it travels down the birth canal (vagina). In a newborn, this can cause central nervous system damage, eye damage, or even death. The virus that causes genital herpes is usually HSV-2 virus. The virus that causes oral herpes is usually HSV-1. The diagnosis (learning what is wrong) is made through culture results. SYMPTOMS  Usually symptoms of pain and itching begin a few days to a week after contact. It first appears as small blisters that progress to small painful ulcers which then scab over and heal after several days. It affects the outer genitalia, birth canal, cervix, penis, anal area, buttocks, and thighs. HOME CARE INSTRUCTIONS   Keep ulcerated areas dry and clean.  Take medications as directed. Antiviral medications can speed up healing. They will not prevent recurrences or cure this infection. These medications can also be taken for suppression if there are frequent recurrences.  While the infection is active, it is contagious. Avoid all sexual contact during active infections.  Condoms may help prevent spread of the herpes virus.  Practice safe sex.  Wash your hands thoroughly after touching the genital area.  Avoid touching your eyes after touching your genital area.  Inform your caregiver if you have had genital herpes and become pregnant. It is your  responsibility to insure a safe outcome for your baby in this pregnancy.  Only take over-the-counter or prescription medicines for pain, discomfort, or fever as directed by your caregiver. SEEK MEDICAL CARE IF:   You have a recurrence of this infection.  You do not respond to medications and are not improving.  You have new sources of pain or discharge which have changed from the original infection.  You have an oral temperature above 102 F (38.9 C).  You develop abdominal pain.  You develop eye pain or signs of eye infection. Document Released: 01/07/2000 Document Revised: 04/03/2011 Document Reviewed: 01/27/2009 [facility] Patient Information 2015 [place], [place]. This information is not intended to replace advice given to you by your health care provider. Make sure you discuss any questions you have with your health care provider.

## 2014-10-16 NOTE — ED Notes (Signed)
Pt reports itching rash with closed swollen "boils" in groin area. Painful raised  rash noted 2 weeks ago. Reported recurrent scalp itching. Pt is concerned that her l/hand-third finger is numb due to a stroke.

## 2014-10-16 NOTE — ED Provider Notes (Signed)
CSN: [id number]     Arrival date & time 10/16/14  1502 History  This chart was scribed for non-physician practitioner Teressa Lower, NP working with Pricilla Loveless, MD by Murriel Hopper, ED Scribe. This patient was seen in room WTR6/WTR6 and the patient's care was started at 3:20 PM.    Chief Complaint  Patient presents with  . Rash    1 week hx of raised rash in groin  . Abscess    itching , painful rash  . Groin Pain     The history is provided by the patient. No language interpreter was used.   HPI Comments: Rebekah Murillo is a 49 y.o. female who presents to the Emergency Department complaining of a spreading, itching rash with associated worsening abscesses to the back of her left upper thigh and vaginal area that have been present for two weeks. Pt states that she had a pimple-like spot on the back of her left thigh that she popped, and states that a few days later she began to have similar spots all over her left thigh and vaginal area in addition to a rash that was very itchy. Pt denies doing anything to treat the pain. Pt also reports having numbness in her left hand and her scalp itching.    Past Medical History  Diagnosis Date  . Major depression   . PTSD (post-traumatic stress disorder)   . Asthma   . Eczema    Past Surgical History  Procedure Laterality Date  . Appendectomy    . Bunionectomy      bilateral  . Orif ankle fracture Right 04/23/2012    Procedure: Right ankle fracture/non-union takedown, grafting, plating;  Surgeon: Cammy Copa, MD;  Location: [facility] OR;  Service: Orthopedics;  Laterality: Right;  Right ankle fracture non-union takedown, grafting, plating    No family history on file. Social History  Substance Use Topics  . Smoking status: Current Every Day Smoker -- 1.50 packs/day for 31 years  . Smokeless tobacco: Not on file     Comment: Patient reports trying to cut back reports was smoking 2 packs a day  . Alcohol Use: No   OB History    No  data available     Review of Systems  Skin: Positive for rash.  Neurological: Positive for numbness.  All other systems reviewed and are negative.     Allergies  Codeine; Naproxen; Penicillins; and Tramadol  Home Medications   Prior to Admission medications   Medication Sig Start Date End Date Taking? Authorizing Provider  ARIPiprazole (ABILIFY) 15 MG tablet Take 15 mg by mouth daily.    Historical Provider, MD  clonazePAM (KLONOPIN) 1 MG tablet Take 1 mg by mouth at bedtime as needed (for sleep).     Historical Provider, MD  gabapentin (NEURONTIN) 300 MG capsule Take 600 mg by mouth 3 (three) times daily. 06/20/12   Shanker Levora Dredge, MD  hydrochlorothiazide (HYDRODIURIL) 25 MG tablet Take 25 mg by mouth daily.    Historical Provider, MD  HYDROcodone-acetaminophen (NORCO) 7.5-325 MG per tablet Take 1 tablet by mouth daily.    Historical Provider, MD  ibuprofen (ADVIL,MOTRIN) 200 MG tablet Take 400 mg by mouth every 6 (six) hours as needed for pain.    Historical Provider, MD  ketoconazole (NIZORAL) 2 % shampoo Apply 1 application topically 2 (two) times a week.    Historical Provider, MD  permethrin (ELIMITE) 5 % cream Apply 1 application topically daily.    Historical Provider, MD  ramipril (ALTACE) 2.5 MG capsule Take 1 capsule (2.5 mg total) by mouth daily. 06/20/12   Shanker Levora Dredge, MD  selenium sulfide (SELSUN BLUE MEDICATED) 1 % LOTN Apply 1 application topically daily. 09/28/12   Trixie Dredge, PA-C  triamcinolone cream (KENALOG) 0.1 % Apply 1 application topically 2 (two) times daily.    Historical Provider, MD   There were no vitals taken for this visit. Physical Exam  Constitutional: She is oriented to person, place, and time. She appears well-developed and well-nourished.  HENT:  Head: Normocephalic and atraumatic.  Cardiovascular: Normal rate.   Pulmonary/Chest: Effort normal and breath sounds normal.  Abdominal: Soft. She exhibits no distension.  Genitourinary:   Indurated area with drainage to the left groin  Musculoskeletal: Normal range of motion.  Neurological: She is alert and oriented to person, place, and time. She exhibits normal muscle tone. Coordination normal.  Skin: Skin is warm and dry.  Psychiatric: She has a normal mood and affect.  Nursing note and vitals reviewed.   ED Course  Procedures (including critical care time)  DIAGNOSTIC STUDIES: Oxygen Saturation is 94% on room air, normal by my interpretation.    COORDINATION OF CARE: 3:25 PM Discussed treatment plan with pt at bedside and pt agreed to plan.   Labs Review Labs Reviewed  I-STAT CHEM 8, ED - Abnormal; Notable for the following:    Glucose, Bld 119 (*)    Hemoglobin 16.7 (*)    HCT 49.0 (*)    All other components within normal limits    Imaging Review No results found. I have personally reviewed and evaluated these images and lab results as part of my medical decision-making.   EKG Interpretation None      MDM   Final diagnoses:  Herpes genitalia  Essential hypertension    Will treat for herpes and htn.pt blood pressure medication removed  I personally performed the services described in this documentation, which was scribed in my presence. The recorded information has been reviewed and is accurate.    Teressa Lower, NP 10/16/14 1622  Pricilla Loveless, MD 10/18/14 1134

## 2014-10-16 NOTE — ED Notes (Signed)
Per EMS pt c/o pustules and rash on bilateral thighs leading to vagina, not going into vagina itself. Pt came to ED because her PCP is a female and she did not want him to evaluate her.

## 2014-10-21 DIAGNOSIS — F3132 Bipolar disorder, current episode depressed, moderate: Secondary | ICD-10-CM | POA: Diagnosis not present

## 2014-11-05 DIAGNOSIS — G8929 Other chronic pain: Secondary | ICD-10-CM | POA: Diagnosis not present

## 2014-11-05 DIAGNOSIS — M545 Low back pain: Secondary | ICD-10-CM | POA: Diagnosis not present

## 2014-11-07 DIAGNOSIS — Z23 Encounter for immunization: Secondary | ICD-10-CM | POA: Diagnosis not present

## 2014-11-12 DIAGNOSIS — F4310 Post-traumatic stress disorder, unspecified: Secondary | ICD-10-CM | POA: Diagnosis not present

## 2014-11-12 DIAGNOSIS — F3132 Bipolar disorder, current episode depressed, moderate: Secondary | ICD-10-CM | POA: Diagnosis not present

## 2014-11-19 DIAGNOSIS — F4310 Post-traumatic stress disorder, unspecified: Secondary | ICD-10-CM | POA: Diagnosis not present

## 2014-11-19 DIAGNOSIS — F3132 Bipolar disorder, current episode depressed, moderate: Secondary | ICD-10-CM | POA: Diagnosis not present

## 2014-12-03 DIAGNOSIS — G8929 Other chronic pain: Secondary | ICD-10-CM | POA: Diagnosis not present

## 2014-12-03 DIAGNOSIS — M545 Low back pain: Secondary | ICD-10-CM | POA: Diagnosis not present

## 2014-12-07 ENCOUNTER — Emergency Department (HOSPITAL_COMMUNITY)
Admission: EM | Admit: 2014-12-07 | Discharge: 2014-12-07 | Discharge status: Against Medical Advice | Payer: Medicare Other | Attending: Emergency Medicine | Admitting: Emergency Medicine

## 2014-12-07 ENCOUNTER — Encounter (HOSPITAL_COMMUNITY): Payer: Self-pay | Admitting: Emergency Medicine

## 2014-12-07 DIAGNOSIS — K121 Other forms of stomatitis: Secondary | ICD-10-CM | POA: Diagnosis not present

## 2014-12-07 DIAGNOSIS — L02222 Furuncle of back [any part, except buttock]: Secondary | ICD-10-CM | POA: Diagnosis not present

## 2014-12-07 DIAGNOSIS — J45909 Unspecified asthma, uncomplicated: Secondary | ICD-10-CM | POA: Insufficient documentation

## 2014-12-07 DIAGNOSIS — F17210 Nicotine dependence, cigarettes, uncomplicated: Secondary | ICD-10-CM | POA: Diagnosis not present

## 2014-12-07 DIAGNOSIS — L0233 Carbuncle of buttock: Secondary | ICD-10-CM | POA: Diagnosis not present

## 2014-12-07 NOTE — ED Notes (Signed)
Per EMS patient has a sore on the roof of her mouth and a "boil between her buttocks." Seen at hospital last month because she found an infection in genital region; patient thinks it was herpes.

## 2014-12-07 NOTE — ED Notes (Signed)
Pt was called 3 times without response from lobby

## 2014-12-09 DIAGNOSIS — K610 Anal abscess: Secondary | ICD-10-CM | POA: Diagnosis not present

## 2014-12-09 DIAGNOSIS — R5382 Chronic fatigue, unspecified: Secondary | ICD-10-CM | POA: Diagnosis not present

## 2014-12-09 DIAGNOSIS — B3789 Other sites of candidiasis: Secondary | ICD-10-CM | POA: Diagnosis not present

## 2014-12-09 DIAGNOSIS — E785 Hyperlipidemia, unspecified: Secondary | ICD-10-CM | POA: Diagnosis not present

## 2014-12-09 DIAGNOSIS — G894 Chronic pain syndrome: Secondary | ICD-10-CM | POA: Diagnosis not present

## 2014-12-09 DIAGNOSIS — R5383 Other fatigue: Secondary | ICD-10-CM | POA: Diagnosis not present

## 2015-01-11 DIAGNOSIS — M25551 Pain in right hip: Secondary | ICD-10-CM | POA: Diagnosis not present

## 2015-01-11 DIAGNOSIS — G890 Central pain syndrome: Secondary | ICD-10-CM | POA: Diagnosis not present

## 2015-01-11 DIAGNOSIS — M25552 Pain in left hip: Secondary | ICD-10-CM | POA: Diagnosis not present

## 2015-01-11 DIAGNOSIS — G603 Idiopathic progressive neuropathy: Secondary | ICD-10-CM | POA: Diagnosis not present

## 2015-01-11 DIAGNOSIS — M79604 Pain in right leg: Secondary | ICD-10-CM | POA: Diagnosis not present

## 2015-01-11 DIAGNOSIS — G894 Chronic pain syndrome: Secondary | ICD-10-CM | POA: Diagnosis not present

## 2015-01-11 DIAGNOSIS — M79605 Pain in left leg: Secondary | ICD-10-CM | POA: Diagnosis not present

## 2015-01-11 DIAGNOSIS — G541 Lumbosacral plexus disorders: Secondary | ICD-10-CM | POA: Diagnosis not present

## 2015-01-11 DIAGNOSIS — Z79891 Long term (current) use of opiate analgesic: Secondary | ICD-10-CM | POA: Diagnosis not present

## 2015-01-13 DIAGNOSIS — G894 Chronic pain syndrome: Secondary | ICD-10-CM | POA: Diagnosis not present

## 2015-01-13 DIAGNOSIS — M25551 Pain in right hip: Secondary | ICD-10-CM | POA: Diagnosis not present

## 2015-01-13 DIAGNOSIS — M25552 Pain in left hip: Secondary | ICD-10-CM | POA: Diagnosis not present

## 2015-01-13 DIAGNOSIS — M79604 Pain in right leg: Secondary | ICD-10-CM | POA: Diagnosis not present

## 2015-01-13 DIAGNOSIS — G603 Idiopathic progressive neuropathy: Secondary | ICD-10-CM | POA: Diagnosis not present

## 2015-01-13 DIAGNOSIS — G541 Lumbosacral plexus disorders: Secondary | ICD-10-CM | POA: Diagnosis not present

## 2015-01-13 DIAGNOSIS — G890 Central pain syndrome: Secondary | ICD-10-CM | POA: Diagnosis not present

## 2015-01-13 DIAGNOSIS — Z79891 Long term (current) use of opiate analgesic: Secondary | ICD-10-CM | POA: Diagnosis not present

## 2015-01-13 DIAGNOSIS — M79605 Pain in left leg: Secondary | ICD-10-CM | POA: Diagnosis not present

## 2015-01-14 ENCOUNTER — Ambulatory Visit (INDEPENDENT_AMBULATORY_CARE_PROVIDER_SITE_OTHER): Payer: Medicare Other | Admitting: Family Medicine

## 2015-01-14 VITALS — BP 118/80 | HR 95 | Temp 97.9°F | Resp 16 | Ht 66.0 in | Wt 188.0 lb

## 2015-01-14 DIAGNOSIS — G894 Chronic pain syndrome: Secondary | ICD-10-CM

## 2015-01-14 DIAGNOSIS — M25571 Pain in right ankle and joints of right foot: Secondary | ICD-10-CM

## 2015-01-14 MED ORDER — HYDROCODONE-ACETAMINOPHEN 7.5-325 MG PO TABS: 1.0000 | ORAL_TABLET | Freq: Four times a day (QID) | ORAL | Status: DC | PRN

## 2015-01-14 NOTE — Progress Notes (Signed)
49 yo patient on chronic pain medication and anti-anxiety medication.  She has been followed by a pain clinic and has been on Norco 10 qid (#120/month).  She went to be seen yesterday by owner of [facility], she was tested yesterday, and they detected alprazolam which would have violated her contract with [facility].  She states that the Buspar must have cross reacted.  She was on Norco and Buspar.  She ran out of Norco 10 days ago with the last prescription filled November 10.  She had to make an appointment for a refill which was Dec 19th.  They wouldn't refill it because they detected Xanax. Then she started alprazolam yesterday.  She wants to get into another pain clinic.  Patient is on probation from taking belongings from a neighbor.  She lives with boyfriend.  Objective:  Pressured speech Exam of feet reveals no significant swelling, bony abnormality, significant tenderness or ecchymosis. Good ROM of feet and ankles.  Assessment:  Patient is a chronic pain patient.  Plan:  Give patient enough medication to get through the holidays and then follow up with Dr. Harrietta GuardianWilliams  Easton Sivertson, MD

## 2015-01-18 ENCOUNTER — Telehealth: Payer: Self-pay

## 2015-01-18 ENCOUNTER — Encounter: Payer: Self-pay | Admitting: Family Medicine

## 2015-01-18 ENCOUNTER — Ambulatory Visit (INDEPENDENT_AMBULATORY_CARE_PROVIDER_SITE_OTHER): Payer: Medicare Other | Admitting: Family Medicine

## 2015-01-18 VITALS — BP 170/90 | HR 97 | Temp 99.0°F | Resp 17 | Ht 65.5 in | Wt 193.4 lb

## 2015-01-18 DIAGNOSIS — G894 Chronic pain syndrome: Secondary | ICD-10-CM | POA: Diagnosis not present

## 2015-01-18 MED ORDER — GABAPENTIN 300 MG PO CAPS: 600.0000 mg | ORAL_CAPSULE | Freq: Three times a day (TID) | ORAL | Status: DC

## 2015-01-18 MED ORDER — CLONIDINE HCL 0.1 MG PO TABS: 0.1000 mg | ORAL_TABLET | Freq: Three times a day (TID) | ORAL | Status: DC
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
## 2015-01-18 NOTE — Patient Instructions (Signed)
I cannot prescribe more narcotic pain medicine.  You need to get hooked up with a pain clinic which we will work on starting tomorrow when the offices open up.

## 2015-01-19 NOTE — Telephone Encounter (Signed)
Patient called today to let us know that is can get a refill of her gabapentin on 01/26/15 per the pharmacy, if it will be prescribed by Dr L.  There is a note in her chart about not prescribing narcotics, and that pain management will be responsible for prescribing. Her referral to [facility] will take about 4-6 weeks before they will contact her about scheduling.  She said the medication cloNIDine (CATAPRES) 0.1 MG tablet is helping to some extent.  However, she is very concerned about the withdrawals, stating that she is afraid that they will kill her if she cannot take the gabapentin due to no refills.  She said she has been taking this medication "all her life" and cannot quit cold [place].  Please advise and contact patient.  CB#: [phone]

## 2015-01-20 ENCOUNTER — Telehealth: Payer: Self-pay
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
## 2015-01-21 NOTE — Telephone Encounter (Signed)
Patient called about her meds again.

## 2015-01-21 NOTE — Telephone Encounter (Signed)
As I have explained several times, patient needs to make due without narcotics.  Otherwise, follow up with her pain clinic doctor

## 2015-01-22 ENCOUNTER — Telehealth: Payer: Self-pay | Admitting: Family Medicine
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
## 2015-01-22 NOTE — Telephone Encounter (Signed)
Noted.  Please advise that refills are at the discretion of Dr. Milus GlazierLauenstein.  [facility].  [facility] reveals she has had several providers try to help her in the last six months.

## 2015-01-22 NOTE — Telephone Encounter (Signed)
Pt.notified

## 2015-01-25 NOTE — Telephone Encounter (Signed)
Patient states that the pharmacist spoke to Dr. Milus GlazierLauenstein and he said that's it's okay to refill. Patient will come in tomorrow to get seen if she doesn't hear anything back but she would still like for someone to call her and let her know something.  [phone]

## 2015-01-25 NOTE — Telephone Encounter (Signed)
Is Dr. Hyman HopesJegede still her PCP? Who is Dr. Mayford KnifeWilliams?  What is the follow up plan? Please clarify the situation.  Dr. Milus GlazierLauenstein is out of the office this week. His last note indicates that he provided enough medication to get "through the holidays and to follow-up with Dr. Mayford KnifeWilliams."

## 2015-01-25 NOTE — Telephone Encounter (Signed)
Patient explained that she will follow up with her PCP, Dr. Mayford KnifeWilliams.

## 2015-01-26 ENCOUNTER — Ambulatory Visit (INDEPENDENT_AMBULATORY_CARE_PROVIDER_SITE_OTHER): Payer: Medicare Other | Admitting: Emergency Medicine

## 2015-01-26 ENCOUNTER — Telehealth: Payer: Self-pay

## 2015-01-26 VITALS — BP 106/72 | HR 96 | Temp 99.4°F | Resp 18

## 2015-01-26 DIAGNOSIS — M25571 Pain in right ankle and joints of right foot: Secondary | ICD-10-CM

## 2015-01-26 DIAGNOSIS — G894 Chronic pain syndrome: Secondary | ICD-10-CM | POA: Diagnosis not present

## 2015-01-26 MED ORDER — HYDROCODONE-ACETAMINOPHEN 7.5-325 MG PO TABS: 1.0000 | ORAL_TABLET | Freq: Four times a day (QID) | ORAL | Status: DC | PRN

## 2015-01-26 NOTE — Patient Instructions (Signed)
[facility] Policy for Prescribing Controlled Substances (Revised 11/2011) 1. Prescriptions for controlled substances will be filled by ONE provider at [facility] with whom you have established and developed a plan for your care, including follow-up. 2. You are encouraged to schedule an appointment with your prescriber at our appointment center for follow-up visits whenever possible. 3. If you request a prescription for the controlled substance while at [facility] for an acute problem (with someone other than your regular prescriber), you MAY be given a ONE-TIME prescription for a 30-day supply of the controlled substance, to allow time for you to return to see your regular prescriber for additional prescriptions     Chronic Back Pain  When back pain lasts longer than 3 months, it is called chronic back pain.People with chronic back pain often go through certain periods that are more intense (flare-ups).  CAUSES Chronic back pain can be caused by wear and tear (degeneration) on different structures in your back. These structures include:  The bones of your spine (vertebrae) and the joints surrounding your spinal cord and nerve roots (facets).  The strong, fibrous tissues that connect your vertebrae (ligaments). Degeneration of these structures may result in pressure on your nerves. This can lead to constant pain. HOME CARE INSTRUCTIONS  Avoid bending, heavy lifting, prolonged sitting, and activities which make the problem worse.  Take brief periods of rest throughout the day to reduce your pain. Lying down or standing usually is better than sitting while you are resting.  Take over-the-counter or prescription medicines only as directed by your caregiver. SEEK IMMEDIATE MEDICAL CARE IF:   You have weakness or numbness in one of your legs or feet.  You have trouble controlling your bladder or bowels.  You have nausea, vomiting, abdominal pain, shortness of breath, or fainting.   This information is not  intended to replace advice given to you by your health care provider. Make sure you discuss any questions you have with your health care provider.   Document Released: 02/17/2004 Document Revised: 04/03/2011 Document Reviewed: 06/29/2014 Elsevier Interactive Patient Education [employer]2016 Elsevier Inc.
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
## 2015-01-26 NOTE — Progress Notes (Signed)
Subjective:  Patient ID: Rebekah Murillo, female    DOB: [birth year]  Age: 50 y.o. MRN: [id number]  CC: Other   HPI Rebekah HartshornRosemary Waller presents  Comes for her biweekly medication refill.  She is not due until 01/29/15.  She is awaiting placement in another pain clinic after violating her pain contract with [facility].  History Coy SaunasRosemary has a past medical history of Major depression (HCC); PTSD (post-traumatic stress disorder); Asthma; and Eczema.   She has past surgical history that includes Appendectomy; Bunionectomy; and ORIF ankle fracture (Right, 04/23/2012).   Her  family history includes Cancer in her mother.  She   reports that she has been smoking Cigarettes.  She has a 62 pack-year smoking history. She has never used smokeless tobacco. She reports that she does not drink alcohol or use illicit drugs.  Outpatient Prescriptions Prior to Visit  Medication Sig Dispense Refill  . gabapentin (NEURONTIN) 300 MG capsule Take 2 capsules (600 mg total) by mouth 3 (three) times daily. 100 capsule 1  . ramipril (ALTACE) 2.5 MG capsule Take 1 capsule (2.5 mg total) by mouth daily. 30 capsule 3  . ramipril (ALTACE) 2.5 MG capsule Take 1 capsule (2.5 mg total) by mouth daily. 30 capsule 0  . HYDROcodone-acetaminophen (NORCO) 7.5-325 MG tablet Take 1 tablet by mouth every 6 (six) hours as needed for moderate pain. 60 tablet 0  . acyclovir (ZOVIRAX) 400 MG tablet Take 1 tablet (400 mg total) by mouth 4 (four) times daily. (Patient not taking: Reported on 01/26/2015) 28 tablet 0  . ARIPiprazole (ABILIFY) 15 MG tablet Take 15 mg by mouth daily. Reported on 01/26/2015    . clonazePAM (KLONOPIN) 1 MG tablet Take 1 mg by mouth at bedtime as needed (for sleep). Reported on 01/26/2015    . cloNIDine (CATAPRES) 0.1 MG tablet Take 1 tablet (0.1 mg total) by mouth 3 (three) times daily. (Patient not taking: Reported on 01/26/2015) 90 tablet 3  . hydrochlorothiazide (HYDRODIURIL) 25 MG tablet Take 25 mg by mouth  daily. Reported on 01/26/2015    . ibuprofen (ADVIL,MOTRIN) 200 MG tablet Take 400 mg by mouth every 6 (six) hours as needed for pain. Reported on 01/26/2015    . ketoconazole (NIZORAL) 2 % shampoo Apply 1 application topically 2 (two) times a week. Reported on 01/26/2015    . permethrin (ELIMITE) 5 % cream Apply 1 application topically daily. Reported on 01/26/2015    . selenium sulfide (SELSUN BLUE MEDICATED) 1 % LOTN Apply 1 application topically daily. (Patient not taking: Reported on 01/26/2015) 420 mL 0  . triamcinolone cream (KENALOG) 0.1 % Apply 1 application topically 2 (two) times daily. Reported on 01/26/2015     No facility-administered medications prior to visit.    Social History   Social History  . Marital Status: Single    Spouse Name: N/A  . Number of Children: N/A  . Years of Education: N/A   Social History Main Topics  . Smoking status: Current Every Day Smoker -- 2.00 packs/day for 31 years    Types: Cigarettes  . Smokeless tobacco: Never Used     Comment: Patient reports trying to cut back reports was smoking 2 packs a day  . Alcohol Use: No  . Drug Use: No  . Sexual Activity: Not Asked   Other Topics Concern  . None   Social History Narrative     Review of Systems  Constitutional: Negative for fever, chills and appetite change.  HENT: Negative for congestion,  ear pain, postnasal drip, sinus pressure and sore throat.   Eyes: Negative for pain and redness.  Respiratory: Negative for cough, shortness of breath and wheezing.   Cardiovascular: Negative for leg swelling.  Gastrointestinal: Negative for nausea, vomiting, abdominal pain, diarrhea, constipation and blood in stool.  Endocrine: Negative for polyuria.  Genitourinary: Negative for dysuria, urgency, frequency and flank pain.  Musculoskeletal: Negative for gait problem.  Skin: Negative for rash.  Neurological: Negative for weakness and headaches.  Psychiatric/Behavioral: Negative for confusion and decreased  concentration. The patient is not nervous/anxious.     Objective:  BP 106/72 mmHg  Pulse 96  Temp(Src) 99.4 F (37.4 C)  Resp 18  SpO2 98%  Physical Exam  Constitutional: She is oriented to person, place, and time. She appears well-developed and well-nourished.  HENT:  Head: Normocephalic and atraumatic.  Eyes: Conjunctivae are normal. Pupils are equal, round, and reactive to light.  Pulmonary/Chest: Effort normal.  Musculoskeletal: She exhibits no edema.  Neurological: She is alert and oriented to person, place, and time.  Skin: Skin is dry.  Psychiatric: She has a normal mood and affect. Her behavior is normal. Thought content normal.      Assessment & Plan:   Debera was seen today for other.  Diagnoses and all orders for this visit:  Chronic pain syndrome -     HYDROcodone-acetaminophen (NORCO) 7.5-325 MG tablet; Take 1 tablet by mouth every 6 (six) hours as needed for moderate pain.  Pain in joint, ankle and foot, right -     HYDROcodone-acetaminophen (NORCO) 7.5-325 MG tablet; Take 1 tablet by mouth every 6 (six) hours as needed for moderate pain.  I am having Ms. Dripps maintain her ramipril, ARIPiprazole, clonazePAM, hydrochlorothiazide, ibuprofen, ketoconazole, triamcinolone cream, permethrin, selenium sulfide, acyclovir, ramipril, cloNIDine, gabapentin, BusPIRone HCl (BUSPAR PO), and HYDROcodone-acetaminophen.  Meds ordered this encounter  Medications  . BusPIRone HCl (BUSPAR PO)    Sig: Take by mouth.  Marland Kitchen HYDROcodone-acetaminophen (NORCO) 7.5-325 MG tablet    Sig: Take 1 tablet by mouth every 6 (six) hours as needed for moderate pain.    Dispense:  60 tablet    Refill:  0   I explained the policy of the office not to do chronic pain management and should that Dr. Faustino Congress agreed to do that before until she is placed she needs to come in and see him personally I gave her a copy of the office controlled substances policy  Appropriate red flag conditions  were discussed with the patient as well as actions that should be taken.  Patient expressed his understanding.  Follow-up: Return in about 2 weeks (around 02/09/2015).  Carmelina Dane, MD

## 2015-01-26 NOTE — Telephone Encounter (Signed)
Pt would like a refill on her HYDROcodone-acetaminophen (NORCO) 7.5-325 MG tablet [[id number]][[id number]]. Please refer to calls from 12/28 and 12/30.   I read her brother friend the messages. CB # [phone]

## 2015-02-07 ENCOUNTER — Telehealth: Payer: Self-pay | Admitting: Family Medicine

## 2015-02-07 NOTE — Telephone Encounter (Signed)
Patient needs a refill on Hydrocodone. 

## 2015-02-08 ENCOUNTER — Ambulatory Visit (INDEPENDENT_AMBULATORY_CARE_PROVIDER_SITE_OTHER): Payer: Medicare Other | Admitting: Family Medicine

## 2015-02-08 VITALS — BP 120/72 | HR 84 | Temp 98.7°F | Resp 20 | Ht 67.0 in | Wt 190.4 lb

## 2015-02-08 DIAGNOSIS — G894 Chronic pain syndrome: Secondary | ICD-10-CM | POA: Diagnosis not present

## 2015-02-08 DIAGNOSIS — R5382 Chronic fatigue, unspecified: Secondary | ICD-10-CM | POA: Diagnosis not present

## 2015-02-08 DIAGNOSIS — M25571 Pain in right ankle and joints of right foot: Secondary | ICD-10-CM | POA: Diagnosis not present

## 2015-02-08 DIAGNOSIS — R21 Rash and other nonspecific skin eruption: Secondary | ICD-10-CM

## 2015-02-08 MED ORDER — HYDROCODONE-ACETAMINOPHEN 7.5-325 MG PO TABS: 1.0000 | ORAL_TABLET | Freq: Four times a day (QID) | ORAL | Status: DC | PRN

## 2015-02-08 MED ORDER — CLOBETASOL PROPIONATE 0.05 % EX LOTN: 1.0000 "application " | TOPICAL_LOTION | Freq: Two times a day (BID) | CUTANEOUS | Status: DC

## 2015-02-08 MED ORDER — GABAPENTIN 300 MG PO CAPS: 600.0000 mg | ORAL_CAPSULE | Freq: Three times a day (TID) | ORAL | Status: DC

## 2015-02-08 NOTE — Telephone Encounter (Signed)
Meds ordered this encounter  Medications  . BusPIRone HCl (BUSPAR PO)    Sig: Take by mouth.  Marland Kitchen. HYDROcodone-acetaminophen (NORCO) 7.5-325 MG tablet    Sig: Take 1 tablet by mouth every 6 (six) hours as needed for moderate pain.    Dispense: 60 tablet    Refill: 0   I explained the policy of the office not to do chronic pain management and should that Dr. Faustino CongressLowenstein agreed to do that before until she is placed she needs to come in and see him personally I gave her a copy of the office controlled substances policy  Appropriate red flag conditions were discussed with the patient as well as actions that should be taken. Patient expressed his understanding.  Follow-up: Return in about 2 weeks (around 02/09/2015).

## 2015-02-08 NOTE — Telephone Encounter (Signed)
Called line busy. RTC.

## 2015-02-08 NOTE — Progress Notes (Addendum)
By signing my name below, I, Stann Oresung-Kai Tsai, attest that this documentation has been prepared under the direction and in the presence of Elvina SidleKurt Rumaldo Difatta, MD. Electronically Signed: Stann Oresung-Kai Tsai, Scribe. 02/08/2015 , 3:34 PM .  Patient was seen in room 12 .   Patient ID: Rebekah Murillo MRN: [id number], DOB: 1965/06/05, 50 y.o. Date of Encounter: 02/08/2015  Primary Physician: Jeanann LewandowskyJEGEDE, OLUGBEMIGA, MD  Chief Complaint:  Chief Complaint  Patient presents with  . Medication Refill    Gabapentin, hydrocodone    HPI:  Rebekah Murillo is a 50 y.o. female who presents to [facility] for medication refill on gabapentin and hydrocodone. The medication is helping her with chronic pain. She's been having issues with pain management. She's still hurting in the same areas: foot with pins and screws and right ankle plate.   She is trying to get a new pain management doctor  She also notes having a few bumps on her scalp. She prefers no labwork done today but will wait to see how medications do before return.   Past Medical History  Diagnosis Date  . Major depression (HCC)   . PTSD (post-traumatic stress disorder)   . Asthma   . Eczema      Home Meds: Prior to Admission medications   Medication Sig Start Date End Date Taking? Authorizing Provider  acyclovir (ZOVIRAX) 400 MG tablet Take 1 tablet (400 mg total) by mouth 4 (four) times daily. Patient not taking: Reported on 01/26/2015 10/16/14   Teressa LowerVrinda Pickering, NP  ARIPiprazole (ABILIFY) 15 MG tablet Take 15 mg by mouth daily. Reported on 01/26/2015    Historical Provider, MD  BusPIRone HCl (BUSPAR PO) Take by mouth.    Historical Provider, MD  clonazePAM (KLONOPIN) 1 MG tablet Take 1 mg by mouth at bedtime as needed (for sleep). Reported on 01/26/2015    Historical Provider, MD  cloNIDine (CATAPRES) 0.1 MG tablet Take 1 tablet (0.1 mg total) by mouth 3 (three) times daily. Patient not taking: Reported on 01/26/2015 01/18/15   Elvina SidleKurt  Haylen Shelnutt, MD  gabapentin (NEURONTIN) 300 MG capsule Take 2 capsules (600 mg total) by mouth 3 (three) times daily. 01/18/15   Elvina SidleKurt Adley Castello, MD  hydrochlorothiazide (HYDRODIURIL) 25 MG tablet Take 25 mg by mouth daily. Reported on 01/26/2015    Historical Provider, MD  HYDROcodone-acetaminophen (NORCO) 7.5-325 MG tablet Take 1 tablet by mouth every 6 (six) hours as needed for moderate pain. 01/26/15   Carmelina DaneJeffery S Anderson, MD  ibuprofen (ADVIL,MOTRIN) 200 MG tablet Take 400 mg by mouth every 6 (six) hours as needed for pain. Reported on 01/26/2015    Historical Provider, MD  ketoconazole (NIZORAL) 2 % shampoo Apply 1 application topically 2 (two) times a week. Reported on 01/26/2015    Historical Provider, MD  permethrin (ELIMITE) 5 % cream Apply 1 application topically daily. Reported on 01/26/2015    Historical Provider, MD  ramipril (ALTACE) 2.5 MG capsule Take 1 capsule (2.5 mg total) by mouth daily. 06/20/12   Shanker Levora DredgeM Ghimire, MD  ramipril (ALTACE) 2.5 MG capsule Take 1 capsule (2.5 mg total) by mouth daily. 10/16/14   Teressa LowerVrinda Pickering, NP  selenium sulfide (SELSUN BLUE MEDICATED) 1 % LOTN Apply 1 application topically daily. Patient not taking: Reported on 01/26/2015 09/28/12   Trixie DredgeEmily West, PA-C  triamcinolone cream (KENALOG) 0.1 % Apply 1 application topically 2 (two) times daily. Reported on 01/26/2015    Historical Provider, MD    Allergies:  Allergies  Allergen Reactions  .  Codeine Hives  . Naproxen Nausea And Vomiting  . Penicillins Hives and Nausea And Vomiting  . Tramadol     Seizure, does not like the way it makes her feel    Social History   Social History  . Marital Status: Single    Spouse Name: N/A  . Number of Children: N/A  . Years of Education: N/A   Occupational History  . Not on file.   Social History Main Topics  . Smoking status: Current Every Day Smoker -- 2.00 packs/day for 31 years    Types: Cigarettes  . Smokeless tobacco: Never Used     Comment: Patient reports  trying to cut back reports was smoking 2 packs a day  . Alcohol Use: No  . Drug Use: No  . Sexual Activity: Not on file   Other Topics Concern  . Not on file   Social History Narrative     Review of Systems: Constitutional: negative for fever, chills, night sweats, weight changes, or fatigue  HEENT: negative for vision changes, hearing loss, congestion, rhinorrhea, ST, epistaxis, or sinus pressure Cardiovascular: negative for chest pain or palpitations Respiratory: negative for hemoptysis, wheezing, shortness of breath, or cough Abdominal: negative for abdominal pain, nausea, vomiting, diarrhea, or constipation Dermatological: negative for rash; positive for bumps (on scalp) Neurologic: negative for headache, dizziness, or syncope Musc: positive for leg pain, ankle pain (right)  All other systems reviewed and are otherwise negative with the exception to those above and in the HPI.  Physical Exam: Blood pressure 120/72, pulse 84, temperature 98.7 F (37.1 C), temperature source Oral, resp. rate 20, height 5\' 7"  (1.702 m), weight 190 lb 6.4 oz (86.365 kg), SpO2 99 %., Body mass index is 29.81 kg/(m^2). General: Well developed, well nourished, in no acute distress. Head: Normocephalic, atraumatic, eyes without discharge, sclera non-icteric, nares are without discharge. Bilateral auditory canals clear, TM's are without perforation, pearly grey and translucent with reflective cone of light bilaterally. Oral cavity moist, posterior pharynx without exudate, erythema, peritonsillar abscess, or post nasal drip.  Neck: Supple. No thyromegaly. Full ROM. No lymphadenopathy. Lungs: Clear bilaterally to auscultation without wheezes, rales, or rhonchi. Breathing is unlabored. Heart: RRR with S1 S2. No murmurs, rubs, or gallops appreciated. Msk:  Strength and tone normal for age. Extremities/Skin: Warm and dry. Little red bumps on scalp that are flat and measure 3-4 cm with irregular borders and have  some peripheral excoriations.  No discharge. Neuro: Alert and oriented X 3. Moves all extremities spontaneously. Gait is normal. CNII-XII grossly in tact. Psych:  Responds to questions appropriately with a normal affect.    ASSESSMENT AND PLAN:  50 y.o. year old female with chronic pain syndrome in her right ankle after many years postop. I've agreed to refill medicines until she get into a pain clinic. I've asked her to come back in a month to go over prevention issues  This chart was scribed in my presence and reviewed by me personally.    ICD-9-CM ICD-10-CM   1. Chronic pain syndrome 338.4 G89.4 Ambulatory referral to Pain Clinic     gabapentin (NEURONTIN) 300 MG capsule     HYDROcodone-acetaminophen (NORCO) 7.5-325 MG tablet  2. Pain in joint, ankle and foot, right 719.47 M25.571 gabapentin (NEURONTIN) 300 MG capsule     HYDROcodone-acetaminophen (NORCO) 7.5-325 MG tablet  3. Rash and nonspecific skin eruption 782.1 R21 Clobetasol Propionate 0.05 % lotion  4. Chronic fatigue 780.79 R53.82     I am of course  concerned about the patient's use of narcotics and I'm trying my best to distance myself from ordering more hydrocodone. I agreed to give her another couple weeks medicine because she seems to think she will be having an appointment within that period of time.   Signed, Elvina Sidle, MD 02/08/2015 3:34 PM

## 2015-02-08 NOTE — Patient Instructions (Signed)
Please return in one month.

## 2015-02-09 DIAGNOSIS — F3132 Bipolar disorder, current episode depressed, moderate: Secondary | ICD-10-CM | POA: Diagnosis not present

## 2015-02-25 DIAGNOSIS — F3132 Bipolar disorder, current episode depressed, moderate: Secondary | ICD-10-CM | POA: Diagnosis not present

## 2015-03-08 ENCOUNTER — Ambulatory Visit (INDEPENDENT_AMBULATORY_CARE_PROVIDER_SITE_OTHER): Payer: Medicare Other | Admitting: Family Medicine

## 2015-03-08 VITALS — BP 119/78 | HR 88 | Temp 98.8°F | Resp 18 | Ht 66.54 in | Wt 187.4 lb

## 2015-03-08 DIAGNOSIS — G894 Chronic pain syndrome: Secondary | ICD-10-CM

## 2015-03-08 DIAGNOSIS — M25571 Pain in right ankle and joints of right foot: Secondary | ICD-10-CM | POA: Diagnosis not present

## 2015-03-08 MED ORDER — HYDROCODONE-ACETAMINOPHEN 7.5-325 MG PO TABS: 1.0000 | ORAL_TABLET | Freq: Four times a day (QID) | ORAL | Status: DC | PRN

## 2015-03-08 MED ORDER — GABAPENTIN 300 MG PO CAPS: 600.0000 mg | ORAL_CAPSULE | Freq: Three times a day (TID) | ORAL | Status: DC

## 2015-03-08 NOTE — Patient Instructions (Addendum)
Since I am retiring in the next couple months, he will need to get your refills from your primary care doctor who is now Lerry Liner.   I will not be able to refill these medicines again.

## 2015-03-08 NOTE — Progress Notes (Signed)
   Subjective:    Patient ID: Rebekah Murillo, female    DOB: 1965-03-26, 50 y.o.   MRN: [id number] By signing my name below, I, Littie Deeds, attest that this documentation has been prepared under the direction and in the presence of Elvina Sidle, MD.  Electronically Signed: Littie Deeds, Medical Scribe. 03/08/2015. 12:33 PM.  HPI HPI Comments: Rebekah Murillo is a 50 y.o. female with a history of chronic pain who presents to the [facility] for a medication refill for gabapentin and hydrocodone. Patient has chronic pain in her feet and her lower back. She has brought copies of her XRs which show pins in her left hallux. She cannot bend her left great toe.    Review of Systems  Musculoskeletal: Positive for back pain and arthralgias.       Objective:   Physical Exam CONSTITUTIONAL: Well developed/well nourished HEAD: Normocephalic/atraumatic EYES: EOM/PERRL ENMT: Mucous membranes moist NECK: supple no meningeal signs SPINE: entire spine nontender. Negative straight leg raise. CV: Regular rate and rhythm. S1/S2 noted, no murmurs/rubs/gallops noted LUNGS: Lungs are clear to auscultation bilaterally, no apparent distress ABDOMEN: soft, nontender, no rebound or guarding GU: no cva tenderness NEURO: Pt is awake/alert, moves all extremitiesx4 EXTREMITIES: pulses normal, full ROM. No edema. Mild puffiness around MTP joint of hallux. Feet: poor hygiene with some dried skin in between web spaces. SKIN: warm, color normal. Multiple excoriations on her back PSYCH: no abnormalities of mood noted      Assessment & Plan:   This chart was scribed in my presence and reviewed by me personally.    ICD-9-CM ICD-10-CM   1. Chronic pain syndrome 338.4 G89.4 HYDROcodone-acetaminophen (NORCO) 7.5-325 MG tablet     gabapentin (NEURONTIN) 300 MG capsule  2. Pain in joint, ankle and foot, right 719.47 M25.571 HYDROcodone-acetaminophen (NORCO) 7.5-325 MG tablet     gabapentin  (NEURONTIN) 300 MG capsule     Since I am retiring in the next couple months, he will need to get your refills from your primary care doctor who is now Lerry Liner.   I will be able to refill these medicines again. Signed, Elvina Sidle, MD

## 2015-03-12 DIAGNOSIS — F419 Anxiety disorder, unspecified: Secondary | ICD-10-CM | POA: Diagnosis not present

## 2015-03-12 DIAGNOSIS — G894 Chronic pain syndrome: Secondary | ICD-10-CM | POA: Diagnosis not present

## 2015-03-12 DIAGNOSIS — I10 Essential (primary) hypertension: Secondary | ICD-10-CM | POA: Diagnosis not present

## 2015-03-12 DIAGNOSIS — G4700 Insomnia, unspecified: Secondary | ICD-10-CM | POA: Diagnosis not present

## 2015-05-06 DIAGNOSIS — F4310 Post-traumatic stress disorder, unspecified: Secondary | ICD-10-CM | POA: Diagnosis not present

## 2015-05-24 ENCOUNTER — Other Ambulatory Visit: Payer: Self-pay | Admitting: Family Medicine

## 2015-05-26 ENCOUNTER — Telehealth: Payer: Self-pay | Admitting: Emergency Medicine

## 2015-05-26 DIAGNOSIS — G894 Chronic pain syndrome: Secondary | ICD-10-CM

## 2015-05-26 DIAGNOSIS — M25571 Pain in right ankle and joints of right foot: Secondary | ICD-10-CM

## 2015-05-26 MED ORDER — GABAPENTIN 300 MG PO CAPS: 600.0000 mg | ORAL_CAPSULE | Freq: Three times a day (TID) | ORAL | Status: DC

## 2015-05-26 NOTE — Telephone Encounter (Signed)
Pt called in requesting Gabapentin refill Medication reordered #[id number] refills , [facility]

## 2015-06-14 ENCOUNTER — Telehealth: Payer: Self-pay | Admitting: *Deleted

## 2015-06-14 ENCOUNTER — Other Ambulatory Visit: Payer: Self-pay | Admitting: Family Medicine

## 2015-06-14 NOTE — Telephone Encounter (Signed)
Pt called and stated that she needed a refill on her hydrocodone because her PCP is going on vacation.  Per Dr. Milus GlazierLauenstein last note she is to get all refills of her medications from Dr. Mayford KnifeWilliams.  Advised pt that she should get refills from him.  She stated that maybe we could help her out this one time.  Per Dr. Milus GlazierLauenstein she can go to the emergency room for a refill until her Dr gets back in town.  Pt hung up on me.

## 2015-07-12 ENCOUNTER — Other Ambulatory Visit: Payer: Self-pay | Admitting: Family Medicine

## 2015-07-13 DIAGNOSIS — I973 Postprocedural hypertension: Secondary | ICD-10-CM | POA: Diagnosis not present

## 2015-07-22 ENCOUNTER — Other Ambulatory Visit: Payer: Self-pay | Admitting: Family Medicine

## 2015-08-06 DIAGNOSIS — G894 Chronic pain syndrome: Secondary | ICD-10-CM | POA: Diagnosis not present

## 2015-09-03 DIAGNOSIS — G894 Chronic pain syndrome: Secondary | ICD-10-CM | POA: Diagnosis not present

## 2015-09-15 ENCOUNTER — Encounter (HOSPITAL_COMMUNITY): Payer: Self-pay

## 2015-09-15 ENCOUNTER — Emergency Department (HOSPITAL_COMMUNITY)
Admission: EM | Admit: 2015-09-15 | Discharge: 2015-09-15 | Disposition: A | Discharge status: Home / Self Care | Payer: Medicare Other | Attending: Emergency Medicine | Admitting: Emergency Medicine

## 2015-09-15 DIAGNOSIS — Z79899 Other long term (current) drug therapy: Secondary | ICD-10-CM | POA: Diagnosis not present

## 2015-09-15 DIAGNOSIS — M79672 Pain in left foot: Secondary | ICD-10-CM | POA: Insufficient documentation

## 2015-09-15 DIAGNOSIS — J45909 Unspecified asthma, uncomplicated: Secondary | ICD-10-CM | POA: Diagnosis not present

## 2015-09-15 DIAGNOSIS — G8929 Other chronic pain: Secondary | ICD-10-CM

## 2015-09-15 DIAGNOSIS — F17210 Nicotine dependence, cigarettes, uncomplicated: Secondary | ICD-10-CM | POA: Insufficient documentation

## 2015-09-15 NOTE — ED Notes (Signed)
Patient was alert, oriented and stable upon discharge. RN went over AVS and patient had no further questions.  

## 2015-09-15 NOTE — ED Provider Notes (Signed)
[facility]-EMERGENCY DEPT Provider Note   CSN: [id number] Arrival date & time: 09/15/15  1856  By signing my name below, I, Modena JanskyAlbert Thayil, attest that this documentation has been prepared under the direction and in the presence of non-physician practitioner, [address]Iyari Hagner, PA-C. Electronically Signed: Modena JanskyAlbert Thayil, Scribe. 09/15/2015. 8:18 PM.  History   Chief Complaint Chief Complaint  Patient presents with  . Foot Pain    The history is provided by the patient and medical records. No language interpreter was used.  Foot Pain  This is a chronic problem. The current episode started more than 1 week ago. The problem occurs constantly. The problem has not changed since onset.Pertinent negatives include no chest pain, no abdominal pain and no shortness of breath. The symptoms are aggravated by walking. Nothing relieves the symptoms. She has tried acetaminophen, a warm compress and a cold compress for the symptoms. The treatment provided no relief.    HPI Comments: Rebekah Murillo is a 50 y.o. female with a PMHx of arthritis and chronic foot pain, who presents to the Emergency Department complaining of chronic L foot pain without acute changes/worsening. She states that wanted some pain medications until she can see her chronic pain doctor, Dr. Mayford KnifeWilliams, because she left her rx of vicodin in [place] when she was there with her father. Pt describes the pain as 8/10 constant, aching/throbing, non-radiating pain in the entire L foot, exacerbated by movement/walking, and unrelieved by ibuprofen, tylenol, and ice/heat. States she doesn't have her vicodin to take, but reports that it wasn't helping anyway and that her pain doctor was talking about switching her to percocet. She hasn't contacted him yet. Pt denies any fever, chills, CP, SOB, abd pain, n/v/d/c, dysuria, hematuria, numbness/tingling, weakness, swelling/redness/warmth, or any other symptoms. No recent injury/trauma. Denies any changes in the chronic  pain.    [place] database review states that pt received 120 of vicodine 10-325 on 09-03-15 at the [facility] in [place].  Past Medical History:  Diagnosis Date  . Asthma   . Eczema   . Major depression (HCC)   . PTSD (post-traumatic stress disorder)     Patient Active Problem List   Diagnosis Date Noted  . Acute bronchitis 05/04/2012  . Dyspnea 05/04/2012  . Pulmonary infiltrates 05/04/2012  . Chronic obstructive airway disease with asthma (HCC) 04/23/2012  . Tobacco abuse 04/23/2012  . PTSD (post-traumatic stress disorder) 04/23/2012  . Depression 04/23/2012  . Abnormal CXR (chest x-ray) 04/23/2012    Past Surgical History:  Procedure Laterality Date  . APPENDECTOMY    . BUNIONECTOMY     bilateral  . ORIF ANKLE FRACTURE Right 04/23/2012   Procedure: Right ankle fracture/non-union takedown, grafting, plating;  Surgeon: Cammy CopaGregory Scott Dean, MD;  [facility];  Service: Orthopedics;  Laterality: Right;  Right ankle fracture non-union takedown, grafting, plating     OB History    No data available       Home Medications    Prior to Admission medications   Medication Sig Start Date End Date Taking? Authorizing Provider  acyclovir (ZOVIRAX) 400 MG tablet Take 1 tablet (400 mg total) by mouth 4 (four) times daily. Patient not taking: Reported on 01/26/2015 10/16/14   Teressa LowerVrinda Pickering, NP  ARIPiprazole (ABILIFY) 15 MG tablet Take 15 mg by mouth daily. Reported on 03/08/2015    Historical Provider, MD  BusPIRone HCl (BUSPAR PO) Take by mouth.    Historical Provider, MD  Clobetasol Propionate 0.05 % lotion Apply 1 application topically 2 (two) times  daily. Patient not taking: Reported on 03/08/2015 02/08/15   Elvina Sidle, MD  clonazePAM (KLONOPIN) 1 MG tablet Take 1 mg by mouth at bedtime as needed (for sleep). Reported on 03/08/2015    Historical Provider, MD  cloNIDine (CATAPRES) 0.1 MG tablet Take 1 tablet (0.1 mg total) by mouth 3 (three) times daily. Patient not taking:  Reported on 03/08/2015 01/18/15   Elvina Sidle, MD  gabapentin (NEURONTIN) 300 MG capsule TAKE 2 CAPSULES(600 MG) BY MOUTH THREE TIMES DAILY 07/24/15   Elvina Sidle, MD  hydrochlorothiazide (HYDRODIURIL) 25 MG tablet Take 25 mg by mouth daily. Reported on 03/08/2015    Historical Provider, MD  HYDROcodone-acetaminophen (NORCO) 7.5-325 MG tablet Take 1 tablet by mouth every 6 (six) hours as needed for moderate pain. 03/08/15   Elvina Sidle, MD  ibuprofen (ADVIL,MOTRIN) 200 MG tablet Take 400 mg by mouth every 6 (six) hours as needed for pain. Reported on 03/08/2015    Historical Provider, MD  ketoconazole (NIZORAL) 2 % shampoo Apply 1 application topically 2 (two) times a week. Reported on 03/08/2015    Historical Provider, MD  permethrin (ELIMITE) 5 % cream Apply 1 application topically daily. Reported on 03/08/2015    Historical Provider, MD  ramipril (ALTACE) 2.5 MG capsule Take 1 capsule (2.5 mg total) by mouth daily. 06/20/12   Shanker Levora Dredge, MD  selenium sulfide (SELSUN BLUE MEDICATED) 1 % LOTN Apply 1 application topically daily. Patient not taking: Reported on 01/26/2015 09/28/12   Trixie Dredge, PA-C  triamcinolone cream (KENALOG) 0.1 % Apply 1 application topically 2 (two) times daily. Reported on 03/08/2015    Historical Provider, MD    Family History Family History  Problem Relation Age of Onset  . Cancer Mother     Social History Social History  Substance Use Topics  . Smoking status: Current Every Day Smoker    Packs/day: 2.00    Years: 31.00    Types: Cigarettes  . Smokeless tobacco: Never Used     Comment: Patient reports trying to cut back reports was smoking 2 packs a day  . Alcohol use No     Allergies   Codeine; Naproxen; Penicillins; and Tramadol   Review of Systems Review of Systems  Constitutional: Negative for chills and fever.  Respiratory: Negative for shortness of breath.   Cardiovascular: Negative for chest pain.  Gastrointestinal: Negative for  abdominal pain, constipation, diarrhea, nausea and vomiting.  Genitourinary: Negative for dysuria, hematuria, vaginal bleeding and vaginal discharge.  Musculoskeletal: Positive for myalgias (Left foot). Negative for arthralgias and joint swelling.  Skin: Negative for color change and wound.  Allergic/Immunologic: Negative for immunocompromised state.  Neurological: Negative for weakness and numbness.  Psychiatric/Behavioral: Negative for confusion.  10 Systems reviewed and are negative for acute change except as noted in the HPI.    Physical Exam Updated Vital Signs There were no vitals taken for this visit.  Physical Exam  Constitutional: She is oriented to person, place, and time. Vital signs are normal. She appears well-developed and well-nourished.  Non-toxic appearance. No distress.  Afebrile, nontoxic, NAD  HENT:  Head: Normocephalic and atraumatic.  Mouth/Throat: Mucous membranes are normal.  Eyes: Conjunctivae and EOM are normal. Right eye exhibits no discharge. Left eye exhibits no discharge.  Neck: Normal range of motion. Neck supple.  Cardiovascular: Normal rate and intact distal pulses.   Pulmonary/Chest: Effort normal. No respiratory distress.  Abdominal: Normal appearance. She exhibits no distension.  Musculoskeletal: Normal range of motion.  Left foot with  FROM intact, with diffuse tenderness over the entire foot but no focal bony TTP, no swelling or crepitus, no deformities, no bruising or abrasions, skin without erythema or warmth, strength and sensations grossly, distal pulses intact.   Neurological: She is alert and oriented to person, place, and time. She has normal strength. No sensory deficit.  Skin: Skin is warm, dry and intact. No rash noted.  Psychiatric: She has a normal mood and affect. Her behavior is normal.  Nursing note and vitals reviewed.    ED Treatments / Results  DIAGNOSTIC STUDIES:  COORDINATION OF CARE: 8:22 PM- Pt advised of plan for  treatment and pt agrees.  Labs (all labs ordered are listed, but only abnormal results are displayed) Labs Reviewed - No data to display  EKG  EKG Interpretation None       Radiology No results found.  Procedures Procedures (including critical care time)  Medications Ordered in ED Medications - No data to display   Initial Impression / Assessment and Plan / ED Course  I have reviewed the triage vital signs and the nursing notes.  Pertinent labs & imaging results that were available during my care of the patient were reviewed by me and considered in my medical decision making (see chart for details).  Clinical Course    50 y.o. female here with chronic foot pain, no acute changes/worsening. NVI with soft compartments, no skin changes. Doubt need for imaging, pt comes with her xrays with her. NCDB reviewed, she just had 120 tabs vicodin filled 12 days ago, discussed that from the ER we don't manage chronic pain, she will need to see her pain specialist, will not be refilling any narcotics. Discussed OTC meds to help with pain, and f/up with her pain specialist in the morning. I explained the diagnosis and have given explicit precautions to return to the ER including for any other new or worsening symptoms. The patient understands and accepts the medical plan as it's been dictated and I have answered their questions. Discharge instructions concerning home care and prescriptions have been given. The patient is STABLE and is discharged to home in good condition.   Final Clinical Impressions(s) / ED Diagnoses   Final diagnoses:  Chronic foot pain, left    New Prescriptions New Prescriptions   No medications on file   I personally performed the services described in this documentation, which was scribed in my presence. The recorded information has been reviewed and is accurate.     Doyle Kunath Camprubi-Soms, PA-C 09/15/15 2045    Mancel BaleElliott Wentz, MD 09/16/15 [phone]

## 2015-09-15 NOTE — ED Triage Notes (Signed)
Pt complains of chronic foot pain from an old injury and arthritis, she states that she left her medication in [place] and she needs some pain medications, she also wants a referral to a primary doctor

## 2015-09-15 NOTE — Discharge Instructions (Signed)
Use your home pain medications for your chronic pain. See your chronic pain specialist for ongoing management of your chronic pain. Use tylenol or motrin for additional pain relief. Use heat or ice to help with pain. Follow up with your regular doctor listed above to have ongoing management of your medical conditions. Return to the ER for changes or worsening symptoms.

## 2015-09-28 DIAGNOSIS — I10 Essential (primary) hypertension: Secondary | ICD-10-CM | POA: Diagnosis not present

## 2015-09-28 DIAGNOSIS — E2839 Other primary ovarian failure: Secondary | ICD-10-CM | POA: Diagnosis not present

## 2015-09-28 DIAGNOSIS — Z131 Encounter for screening for diabetes mellitus: Secondary | ICD-10-CM | POA: Diagnosis not present

## 2015-09-28 DIAGNOSIS — Z1239 Encounter for other screening for malignant neoplasm of breast: Secondary | ICD-10-CM | POA: Diagnosis not present

## 2015-09-28 DIAGNOSIS — S92902A Unspecified fracture of left foot, initial encounter for closed fracture: Secondary | ICD-10-CM | POA: Diagnosis not present

## 2015-09-28 DIAGNOSIS — G894 Chronic pain syndrome: Secondary | ICD-10-CM | POA: Diagnosis not present

## 2015-09-28 DIAGNOSIS — Z23 Encounter for immunization: Secondary | ICD-10-CM | POA: Diagnosis not present

## 2015-09-28 DIAGNOSIS — Z13220 Encounter for screening for lipoid disorders: Secondary | ICD-10-CM | POA: Diagnosis not present

## 2015-09-28 DIAGNOSIS — F331 Major depressive disorder, recurrent, moderate: Secondary | ICD-10-CM | POA: Diagnosis not present

## 2015-10-06 ENCOUNTER — Other Ambulatory Visit: Payer: Self-pay | Admitting: Internal Medicine

## 2015-10-06 DIAGNOSIS — E2839 Other primary ovarian failure: Secondary | ICD-10-CM

## 2015-10-07 DIAGNOSIS — I10 Essential (primary) hypertension: Secondary | ICD-10-CM | POA: Diagnosis not present

## 2015-10-07 DIAGNOSIS — G894 Chronic pain syndrome: Secondary | ICD-10-CM | POA: Diagnosis not present

## 2015-10-15 DIAGNOSIS — G894 Chronic pain syndrome: Secondary | ICD-10-CM | POA: Diagnosis not present

## 2015-10-29 DIAGNOSIS — G894 Chronic pain syndrome: Secondary | ICD-10-CM | POA: Diagnosis not present

## 2015-10-29 DIAGNOSIS — M79671 Pain in right foot: Secondary | ICD-10-CM | POA: Diagnosis not present

## 2015-10-29 DIAGNOSIS — M5416 Radiculopathy, lumbar region: Secondary | ICD-10-CM | POA: Diagnosis not present

## 2015-10-29 DIAGNOSIS — M79672 Pain in left foot: Secondary | ICD-10-CM | POA: Diagnosis not present

## 2015-10-29 DIAGNOSIS — M545 Low back pain: Secondary | ICD-10-CM | POA: Diagnosis not present

## 2015-11-11 ENCOUNTER — Other Ambulatory Visit: Payer: Medicare Other

## 2015-11-17 ENCOUNTER — Other Ambulatory Visit: Payer: Self-pay | Admitting: Internal Medicine

## 2015-11-17 DIAGNOSIS — Z1231 Encounter for screening mammogram for malignant neoplasm of breast: Secondary | ICD-10-CM

## 2015-11-18 ENCOUNTER — Other Ambulatory Visit: Payer: Medicare Other

## 2015-11-30 ENCOUNTER — Ambulatory Visit: Payer: Medicare Other

## 2015-11-30 ENCOUNTER — Inpatient Hospital Stay: Admission: RE | Admit: 2015-11-30 | Payer: Medicare Other | Source: Ambulatory Visit

## 2015-12-13 DIAGNOSIS — M79673 Pain in unspecified foot: Secondary | ICD-10-CM | POA: Diagnosis not present

## 2015-12-13 DIAGNOSIS — G894 Chronic pain syndrome: Secondary | ICD-10-CM | POA: Diagnosis not present

## 2015-12-13 DIAGNOSIS — M545 Low back pain: Secondary | ICD-10-CM | POA: Diagnosis not present

## 2015-12-14 ENCOUNTER — Other Ambulatory Visit: Payer: Medicare Other

## 2015-12-14 ENCOUNTER — Ambulatory Visit: Payer: Medicare Other

## 2016-01-10 DIAGNOSIS — G894 Chronic pain syndrome: Secondary | ICD-10-CM | POA: Diagnosis not present

## 2016-01-10 DIAGNOSIS — M545 Low back pain: Secondary | ICD-10-CM | POA: Diagnosis not present

## 2016-01-10 DIAGNOSIS — M79673 Pain in unspecified foot: Secondary | ICD-10-CM | POA: Diagnosis not present

## 2016-01-19 DIAGNOSIS — M545 Low back pain: Secondary | ICD-10-CM | POA: Diagnosis not present

## 2016-01-19 DIAGNOSIS — M79673 Pain in unspecified foot: Secondary | ICD-10-CM | POA: Diagnosis not present

## 2016-01-19 DIAGNOSIS — G894 Chronic pain syndrome: Secondary | ICD-10-CM | POA: Diagnosis not present

## 2016-02-18 DIAGNOSIS — M79673 Pain in unspecified foot: Secondary | ICD-10-CM | POA: Diagnosis not present

## 2016-02-18 DIAGNOSIS — G894 Chronic pain syndrome: Secondary | ICD-10-CM | POA: Diagnosis not present

## 2016-02-18 DIAGNOSIS — M545 Low back pain: Secondary | ICD-10-CM | POA: Diagnosis not present

## 2016-04-07 DIAGNOSIS — G894 Chronic pain syndrome: Secondary | ICD-10-CM | POA: Diagnosis not present

## 2016-04-07 DIAGNOSIS — M545 Low back pain: Secondary | ICD-10-CM | POA: Diagnosis not present

## 2016-04-07 DIAGNOSIS — M79673 Pain in unspecified foot: Secondary | ICD-10-CM | POA: Diagnosis not present

## 2016-04-20 DIAGNOSIS — L663 Perifolliculitis capitis abscedens: Secondary | ICD-10-CM | POA: Diagnosis not present

## 2016-04-20 DIAGNOSIS — F17210 Nicotine dependence, cigarettes, uncomplicated: Secondary | ICD-10-CM | POA: Diagnosis not present

## 2016-04-20 DIAGNOSIS — S6981XA Other specified injuries of right wrist, hand and finger(s), initial encounter: Secondary | ICD-10-CM | POA: Diagnosis not present

## 2016-04-20 DIAGNOSIS — Z131 Encounter for screening for diabetes mellitus: Secondary | ICD-10-CM | POA: Diagnosis not present

## 2016-04-20 DIAGNOSIS — G894 Chronic pain syndrome: Secondary | ICD-10-CM | POA: Diagnosis not present

## 2016-04-20 DIAGNOSIS — I10 Essential (primary) hypertension: Secondary | ICD-10-CM | POA: Diagnosis not present

## 2016-04-20 DIAGNOSIS — Z1239 Encounter for other screening for malignant neoplasm of breast: Secondary | ICD-10-CM | POA: Diagnosis not present

## 2016-04-20 DIAGNOSIS — Z13220 Encounter for screening for lipoid disorders: Secondary | ICD-10-CM | POA: Diagnosis not present

## 2016-05-05 DIAGNOSIS — G894 Chronic pain syndrome: Secondary | ICD-10-CM | POA: Diagnosis not present

## 2016-05-05 DIAGNOSIS — M79673 Pain in unspecified foot: Secondary | ICD-10-CM | POA: Diagnosis not present

## 2016-05-05 DIAGNOSIS — M545 Low back pain: Secondary | ICD-10-CM | POA: Diagnosis not present

## 2016-05-09 DIAGNOSIS — L259 Unspecified contact dermatitis, unspecified cause: Secondary | ICD-10-CM | POA: Diagnosis not present

## 2016-05-09 DIAGNOSIS — Z716 Tobacco abuse counseling: Secondary | ICD-10-CM | POA: Diagnosis not present

## 2016-05-09 DIAGNOSIS — F17210 Nicotine dependence, cigarettes, uncomplicated: Secondary | ICD-10-CM | POA: Diagnosis not present

## 2016-05-09 DIAGNOSIS — Z124 Encounter for screening for malignant neoplasm of cervix: Secondary | ICD-10-CM | POA: Diagnosis not present

## 2016-05-09 DIAGNOSIS — N760 Acute vaginitis: Secondary | ICD-10-CM | POA: Diagnosis not present

## 2016-05-09 DIAGNOSIS — G894 Chronic pain syndrome: Secondary | ICD-10-CM | POA: Diagnosis not present

## 2016-05-09 DIAGNOSIS — I10 Essential (primary) hypertension: Secondary | ICD-10-CM | POA: Diagnosis not present

## 2016-06-01 DIAGNOSIS — G894 Chronic pain syndrome: Secondary | ICD-10-CM | POA: Diagnosis not present

## 2016-06-01 DIAGNOSIS — M545 Low back pain: Secondary | ICD-10-CM | POA: Diagnosis not present

## 2016-06-01 DIAGNOSIS — M79673 Pain in unspecified foot: Secondary | ICD-10-CM | POA: Diagnosis not present

## 2016-06-08 DIAGNOSIS — F331 Major depressive disorder, recurrent, moderate: Secondary | ICD-10-CM | POA: Diagnosis not present

## 2016-06-08 DIAGNOSIS — Z716 Tobacco abuse counseling: Secondary | ICD-10-CM | POA: Diagnosis not present

## 2016-06-08 DIAGNOSIS — G894 Chronic pain syndrome: Secondary | ICD-10-CM | POA: Diagnosis not present

## 2016-06-08 DIAGNOSIS — I10 Essential (primary) hypertension: Secondary | ICD-10-CM | POA: Diagnosis not present

## 2016-06-08 DIAGNOSIS — K5900 Constipation, unspecified: Secondary | ICD-10-CM | POA: Diagnosis not present

## 2016-06-08 DIAGNOSIS — F17210 Nicotine dependence, cigarettes, uncomplicated: Secondary | ICD-10-CM | POA: Diagnosis not present

## 2016-06-08 DIAGNOSIS — S92902A Unspecified fracture of left foot, initial encounter for closed fracture: Secondary | ICD-10-CM | POA: Diagnosis not present

## 2016-06-15 DIAGNOSIS — F411 Generalized anxiety disorder: Secondary | ICD-10-CM | POA: Diagnosis not present

## 2016-06-28 DIAGNOSIS — M545 Low back pain: Secondary | ICD-10-CM | POA: Diagnosis not present

## 2016-06-28 DIAGNOSIS — M79673 Pain in unspecified foot: Secondary | ICD-10-CM | POA: Diagnosis not present

## 2016-06-28 DIAGNOSIS — G894 Chronic pain syndrome: Secondary | ICD-10-CM | POA: Diagnosis not present

## 2016-06-28 DIAGNOSIS — M5416 Radiculopathy, lumbar region: Secondary | ICD-10-CM | POA: Diagnosis not present

## 2016-07-28 ENCOUNTER — Emergency Department (HOSPITAL_COMMUNITY)
Admission: EM | Admit: 2016-07-28 | Discharge: 2016-07-28 | Disposition: A | Discharge status: Home / Self Care | Payer: Medicare Other | Attending: Emergency Medicine | Admitting: Emergency Medicine

## 2016-07-28 ENCOUNTER — Encounter (HOSPITAL_COMMUNITY): Payer: Self-pay | Admitting: Emergency Medicine

## 2016-07-28 DIAGNOSIS — F17210 Nicotine dependence, cigarettes, uncomplicated: Secondary | ICD-10-CM | POA: Insufficient documentation

## 2016-07-28 DIAGNOSIS — I10 Essential (primary) hypertension: Secondary | ICD-10-CM | POA: Diagnosis not present

## 2016-07-28 DIAGNOSIS — Z760 Encounter for issue of repeat prescription: Secondary | ICD-10-CM | POA: Insufficient documentation

## 2016-07-28 DIAGNOSIS — Z79899 Other long term (current) drug therapy: Secondary | ICD-10-CM | POA: Diagnosis not present

## 2016-07-28 DIAGNOSIS — J45909 Unspecified asthma, uncomplicated: Secondary | ICD-10-CM | POA: Diagnosis not present

## 2016-07-28 HISTORY — DX: Essential (primary) hypertension: I10

## 2016-07-28 HISTORY — DX: Bronchitis, not specified as acute or chronic: J40

## 2016-07-28 MED ORDER — ONDANSETRON HCL 4 MG PO TABS: 4.0000 mg | ORAL_TABLET | Freq: Three times a day (TID) | ORAL | 0 refills | Status: DC | PRN

## 2016-07-28 MED ORDER — LOPERAMIDE HCL 2 MG PO CAPS: 2.0000 mg | ORAL_CAPSULE | Freq: Four times a day (QID) | ORAL | 0 refills | Status: DC | PRN

## 2016-07-28 MED ORDER — METHOCARBAMOL 500 MG PO TABS: 500.0000 mg | ORAL_TABLET | Freq: Two times a day (BID) | ORAL | 0 refills | Status: DC

## 2016-07-28 NOTE — ED Triage Notes (Signed)
Pt is requesting a refill of hydrocodone. PCP is not available

## 2016-07-28 NOTE — Discharge Instructions (Signed)
You may use Zofran as needed for nausea, Imodium as needed for diarrhea, Robaxin as needed for muscle spasms. Follow-up with your pain clinic on Monday. Return to the emergency department if you develop fever, chills, or any new or worsening symptoms.

## 2016-07-28 NOTE — ED Triage Notes (Signed)
Pt is not in lobby 
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
## 2016-07-31 DIAGNOSIS — M545 Low back pain: Secondary | ICD-10-CM | POA: Diagnosis not present

## 2016-07-31 DIAGNOSIS — G894 Chronic pain syndrome: Secondary | ICD-10-CM | POA: Diagnosis not present

## 2016-09-03 ENCOUNTER — Emergency Department

## 2016-09-03 ENCOUNTER — Inpatient Hospital Stay
Admit: 2016-09-03 | Discharge: 2016-09-03 | Disposition: A | Discharge status: Home / Self Care | Payer: MEDICARE | Attending: Emergency Medicine

## 2016-09-03 ENCOUNTER — Emergency Department: Admit: 2016-09-03 | Payer: MEDICARE | Primary: Family Medicine

## 2016-09-03 DIAGNOSIS — M25561 Pain in right knee: Secondary | ICD-10-CM | POA: Diagnosis not present

## 2016-09-03 DIAGNOSIS — R609 Edema, unspecified: Secondary | ICD-10-CM | POA: Diagnosis not present

## 2016-09-03 DIAGNOSIS — M7122 Synovial cyst of popliteal space [Baker], left knee: Secondary | ICD-10-CM | POA: Diagnosis not present

## 2016-09-03 DIAGNOSIS — M7989 Other specified soft tissue disorders: Secondary | ICD-10-CM | POA: Diagnosis not present

## 2016-09-03 DIAGNOSIS — R079 Chest pain, unspecified: Secondary | ICD-10-CM | POA: Diagnosis not present

## 2016-09-03 DIAGNOSIS — R600 Localized edema: Secondary | ICD-10-CM

## 2016-09-03 LAB — EKG 12-LEAD
Atrial Rate: 73 {beats}/min
Diagnosis: NORMAL
P Axis: 56 degrees
P-R Interval: 174 ms
Q-T Interval: 428 ms
QRS Duration: 106 ms
QTc Calculation (Bazett): 471 ms
R Axis: 65 degrees
T Axis: 26 degrees
Ventricular Rate: 73 {beats}/min

## 2016-09-03 LAB — EKG, 12 LEAD, INITIAL
Atrial Rate: 73 {beats}/min
Calculated P Axis: 56 degrees
Calculated R Axis: 65 degrees
Calculated T Axis: 26 degrees
Diagnosis: NORMAL
P-R Interval: 174 ms
Q-T Interval: 428 ms
QRS Duration: 106 ms
QTC Calculation (Bezet): 471 ms
Ventricular Rate: 73 {beats}/min

## 2016-09-03 LAB — CBC WITH AUTOMATED DIFF
ABS. BASOPHILS: 0.1 10*3/uL (ref 0.0–0.1)
ABS. EOSINOPHILS: 0.2 10*3/uL (ref 0.0–0.4)
ABS. IMM. GRANS.: 0 10*3/uL (ref 0.00–0.04)
ABS. LYMPHOCYTES: 3.5 10*3/uL (ref 0.8–3.5)
ABS. MONOCYTES: 0.9 10*3/uL (ref 0.0–1.0)
ABS. NEUTROPHILS: 6.8 10*3/uL (ref 1.8–8.0)
ABSOLUTE NRBC: 0 10*3/uL (ref 0.00–0.01)
BASOPHILS: 1 % (ref 0–1)
EOSINOPHILS: 2 % (ref 0–7)
HCT: 38.3 % (ref 35.0–47.0)
HGB: 12.5 g/dL (ref 11.5–16.0)
IMMATURE GRANULOCYTES: 0 % (ref 0.0–0.5)
LYMPHOCYTES: 30 % (ref 12–49)
MCH: 30.2 PG (ref 26.0–34.0)
MCHC: 32.6 g/dL (ref 30.0–36.5)
MCV: 92.5 FL (ref 80.0–99.0)
MONOCYTES: 8 % (ref 5–13)
MPV: 10.3 FL (ref 8.9–12.9)
NEUTROPHILS: 59 % (ref 32–75)
NRBC: 0 PER 100 WBC
PLATELET: 332 10*3/uL (ref 150–400)
RBC: 4.14 M/uL (ref 3.80–5.20)
RDW: 14.4 % (ref 11.5–14.5)
WBC: 11.5 10*3/uL — ABNORMAL HIGH (ref 3.6–11.0)

## 2016-09-03 LAB — METABOLIC PANEL, COMPREHENSIVE
A-G Ratio: 0.8 — ABNORMAL LOW (ref 1.1–2.2)
ALT (SGPT): 33 U/L (ref 12–78)
AST (SGOT): 30 U/L (ref 15–37)
Albumin: 3.5 g/dL (ref 3.5–5.0)
Alk. phosphatase: 108 U/L (ref 45–117)
Anion gap: 7 mmol/L (ref 5–15)
BUN/Creatinine ratio: 13 (ref 12–20)
BUN: 10 MG/DL (ref 6–20)
Bilirubin, total: 0.3 MG/DL (ref 0.2–1.0)
CO2: 26 mmol/L (ref 21–32)
Calcium: 8.4 MG/DL — ABNORMAL LOW (ref 8.5–10.1)
Chloride: 106 mmol/L (ref 97–108)
Creatinine: 0.8 MG/DL (ref 0.55–1.02)
GFR est AA: >60 mL/min/{1.73_m2} (ref >60)
GFR est non-AA: >60 mL/min/{1.73_m2} (ref >60)
Globulin: 4.5 g/dL — ABNORMAL HIGH (ref 2.0–4.0)
Glucose: 95 mg/dL (ref 65–100)
Potassium: 3.7 mmol/L (ref 3.5–5.1)
Protein, total: 8 g/dL (ref 6.4–8.2)
Sodium: 139 mmol/L (ref 136–145)

## 2016-09-03 LAB — URINALYSIS W/ REFLEX CULTURE
Bacteria: NEGATIVE /hpf
Bilirubin: NEGATIVE
Glucose: NEGATIVE mg/dL
Ketone: NEGATIVE mg/dL
Leukocyte Esterase: NEGATIVE
Nitrites: NEGATIVE
Protein: NEGATIVE mg/dL
Specific gravity: 1.01 (ref 1.003–1.030)
Urobilinogen: 0.2 EU/dL (ref 0.2–1.0)
pH (UA): 5.5 (ref 5.0–8.0)

## 2016-09-03 LAB — TROPONIN I
Troponin-I, Qt.: 0.06 ng/mL — ABNORMAL HIGH (ref <0.05)
Troponin-I, Qt.: 0.05 ng/mL — ABNORMAL HIGH (ref <0.05)

## 2016-09-03 LAB — NT-PRO BNP: NT pro-BNP: 347 PG/ML — ABNORMAL HIGH (ref 0–125)

## 2016-09-03 LAB — CK W/ CKMB & INDEX
CK - MB: 4.7 NG/ML — ABNORMAL HIGH (ref <3.6)
CK-MB Index: 1.5 (ref 0–2.5)
CK: 319 U/L — ABNORMAL HIGH (ref 26–192)

## 2016-09-03 LAB — ETHYL ALCOHOL: ALCOHOL(ETHYL),SERUM: 54 MG/DL — ABNORMAL HIGH (ref <10)

## 2016-09-03 MED ORDER — SODIUM CHLORIDE 0.9 % IJ SYRG
Freq: Three times a day (TID) | INTRAMUSCULAR | Status: DC
Start: 2016-09-03 — End: 2016-09-03

## 2016-09-03 MED ORDER — SODIUM CHLORIDE 0.9 % IJ SYRG
INTRAMUSCULAR | Status: DC | PRN
Start: 2016-09-03 — End: 2016-09-03

## 2016-09-03 NOTE — ED Notes (Signed)
Pt discharged by Dr. Bryson HaKwan. Pt provided with discharge instructions Rx and instructions on follow up care. Pt out of ED ambulatory without difficulty.  Pt reports she walked here and will be back up to visit her mother who is a pt here.

## 2016-09-03 NOTE — ED Notes (Signed)
Pt arrives ambulatory to room c/o swelling to bilateral feet x 3 days with chest pain and shortness of breath with exertion. Pt also states she has some boils on her buttocks. Pt states her mother is dying upstairs with cancer and she has been drinking today. Monitor x 3. Call bell in reach and plan of care discussed.

## 2016-09-03 NOTE — ED Notes (Signed)
TRANSFER - IN REPORT:    Verbal report received from [place] on Adah Salvageosemary Ann Hanton.  Report consisted of patient???s Situation, Background, Assessment and   Recommendations(SBAR).   Information from the following report(s) SBAR and ED Summary was reviewed with the receiving nurse.  Opportunity for questions and clarification was provided.

## 2016-09-03 NOTE — ED Provider Notes (Signed)
EMERGENCY DEPARTMENT HISTORY AND PHYSICAL EXAM          Date: 09/03/2016  Patient Name: Jacqueline Monroe    History of Presenting Illness     Chief Complaint   Patient presents with   ??? Leg Swelling     bilateral x 3 days. hx of htn       History Provided By: Patient    HPI: Jacqueline Monroe is a 51 y.o. female, pmhx HTN, who presents ambulatory to the ED c/o gradually worsening BLE swelling over the last 3 days. Pt denies any hx of similar sxs. She denies any modifying factors. Pt notes her mother is currently upstairs in the hospital on hospice care, and states she has been sleeping in a chair beside her mother's bed. She reports traveling ~6 hours by bus to get to the hospital last week. Pt notes adherence with her rx'd Ramipril. On further questioning, pt states endorses drinking "a few beers" yesterday evening. She additionally notes she is currently on ABX for multiple in grown hairs. Pt otherwise specifically denies any recent fever, chills, nausea, vomiting, diarrhea, abd pain, CP, SOB, lightheadedness, dizziness, numbness, weakness, tingling, HA, heart palpitations, urinary sxs, changes in BM, changes in PO intake, melena, hematochezia, cough, or congestion.     PCP: Marveen Reeks, MD    PMHx: Significant for HTN  PSHx: Significant for orthopedic surgery  Social Hx: +tobacco, +EtOH, -Illicit Drugs    There are no other complaints, changes, or physical findings at this time.     Current Facility-Administered Medications   Medication Dose Route Frequency Provider Last Rate Last Dose   ??? sodium chloride (NS) flush 5-10 mL  5-10 mL IntraVENous Q8H Kari Montero N O'Bier, MD       ??? sodium chloride (NS) flush 5-10 mL  5-10 mL IntraVENous PRN Ida Uppal N O'Bier, MD         Current Outpatient Prescriptions   Medication Sig Dispense Refill   ??? hydrocodone-acetaminophen (NORCO) 7.5-325 mg per tablet Take 1 Tab by mouth every six (6) hours as needed for Pain. 30 Tab 0       Past History     Past Medical History:   History reviewed. No pertinent past medical history.    Past Surgical History:  History reviewed. No pertinent surgical history.    Family History:  History reviewed. No pertinent family history.    Social History:  Social History   Substance Use Topics   ??? Smoking status: Current Every Day Smoker   ??? Smokeless tobacco: Never Used   ??? Alcohol use Yes       Allergies:  Allergies   Allergen Reactions   ??? Pcn [Penicillins] Itching         Review of Systems   Review of Systems   Constitutional: Negative for chills and fever.   HENT: Negative for congestion, ear pain, rhinorrhea and sore throat.    Respiratory: Negative for cough and shortness of breath.    Cardiovascular: Positive for leg swelling (BLE). Negative for chest pain and palpitations.   Gastrointestinal: Negative for abdominal pain, constipation, diarrhea, nausea and vomiting.        No melena  No hematochezia   Endocrine: Negative for polyuria.   Genitourinary: Negative for dysuria, frequency and hematuria.   Neurological: Negative for dizziness, weakness, light-headedness, numbness and headaches.        No tingling   All other systems reviewed and are negative.  Physical Exam   Physical Exam   Nursing note and vitals reviewed.    General appearance - overweight, well appearing, and in no distress  Eyes - pupils equal and reactive, extraocular eye movements intact  ENT - mucous membranes moist, pharynx normal without lesions, edentulous  Neck - supple, no significant adenopathy; non-tender to palpation  Chest - clear to auscultation, no wheezes, rales or rhonchi; non-tender to palpation  Heart - normal rate and regular rhythm, S1 and S2 normal, no murmurs noted  Abdomen - soft, nontender, nondistended, no masses or organomegaly  Musculoskeletal - no joint tenderness, deformity or swelling; normal ROM  Extremities - peripheral pulses normal, 2+ pitting edema to BLE  Skin - normal coloration and turgor, no rashes, abrasions on the dorsal  aspect of both of her feet  Neurological - alert, oriented x3, normal speech, no focal findings or movement disorder noted  Written by Donny Pique, ED Scribe, as dictated by Mahalia Dykes N O'Bier, MD      Diagnostic Study Results     Labs -     Recent Results (from the past 12 hour(s))   EKG, 12 LEAD, INITIAL    Collection Time: 09/03/16  4:42 AM   Result Value Ref Range    Ventricular Rate 73 BPM    Atrial Rate 73 BPM    P-R Interval 174 ms    QRS Duration 106 ms    Q-T Interval 428 ms    QTC Calculation (Bezet) 471 ms    Calculated P Axis 56 degrees    Calculated R Axis 65 degrees    Calculated T Axis 26 degrees    Diagnosis       Normal sinus rhythm  Possible Inferior infarct , age undetermined  No previous ECGs available     CBC WITH AUTOMATED DIFF    Collection Time: 09/03/16  5:35 AM   Result Value Ref Range    WBC 11.5 (H) 3.6 - 11.0 K/uL    RBC 4.14 3.80 - 5.20 M/uL    HGB 12.5 11.5 - 16.0 g/dL    HCT 38.3 35.0 - 47.0 %    MCV 92.5 80.0 - 99.0 FL    MCH 30.2 26.0 - 34.0 PG    MCHC 32.6 30.0 - 36.5 g/dL    RDW 14.4 11.5 - 14.5 %    PLATELET 332 150 - 400 K/uL    MPV 10.3 8.9 - 12.9 FL    NRBC 0.0 0 PER 100 WBC    ABSOLUTE NRBC 0.00 0.00 - 0.01 K/uL    NEUTROPHILS 59 32 - 75 %    LYMPHOCYTES 30 12 - 49 %    MONOCYTES 8 5 - 13 %    EOSINOPHILS 2 0 - 7 %    BASOPHILS 1 0 - 1 %    IMMATURE GRANULOCYTES 0 0.0 - 0.5 %    ABS. NEUTROPHILS 6.8 1.8 - 8.0 K/UL    ABS. LYMPHOCYTES 3.5 0.8 - 3.5 K/UL    ABS. MONOCYTES 0.9 0.0 - 1.0 K/UL    ABS. EOSINOPHILS 0.2 0.0 - 0.4 K/UL    ABS. BASOPHILS 0.1 0.0 - 0.1 K/UL    ABS. IMM. GRANS. 0.0 0.00 - 0.04 K/UL    DF AUTOMATED     METABOLIC PANEL, COMPREHENSIVE    Collection Time: 09/03/16  5:35 AM   Result Value Ref Range    Sodium 139 136 - 145 mmol/L    Potassium 3.7 3.5 - 5.1  mmol/L    Chloride 106 97 - 108 mmol/L    CO2 26 21 - 32 mmol/L    Anion gap 7 5 - 15 mmol/L    Glucose 95 65 - 100 mg/dL    BUN 10 6 - 20 MG/DL    Creatinine 0.80 0.55 - 1.02 MG/DL     BUN/Creatinine ratio 13 12 - 20      GFR est AA >60 >60 ml/min/1.58m    GFR est non-AA >60 >60 ml/min/1.769m   Calcium 8.4 (L) 8.5 - 10.1 MG/DL    Bilirubin, total 0.3 0.2 - 1.0 MG/DL    ALT (SGPT) 33 12 - 78 U/L    AST (SGOT) 30 15 - 37 U/L    Alk. phosphatase 108 45 - 117 U/L    Protein, total 8.0 6.4 - 8.2 g/dL    Albumin 3.5 3.5 - 5.0 g/dL    Globulin 4.5 (H) 2.0 - 4.0 g/dL    A-G Ratio 0.8 (L) 1.1 - 2.2     CK W/ CKMB & INDEX    Collection Time: 09/03/16  5:35 AM   Result Value Ref Range    CK 319 (H) 26 - 192 U/L    CK - MB 4.7 (H) <3.6 NG/ML    CK-MB Index 1.5 0 - 2.5     TROPONIN I    Collection Time: 09/03/16  5:35 AM   Result Value Ref Range    Troponin-I, Qt. 0.06 (H) <0.05 ng/mL   ETHYL ALCOHOL    Collection Time: 09/03/16  5:35 AM   Result Value Ref Range    ALCOHOL(ETHYL),SERUM 54 (H) <10 MG/DL   NT-PRO BNP    Collection Time: 09/03/16  5:35 AM   Result Value Ref Range    NT pro-BNP 347 (H) 0 - 125 PG/ML   URINALYSIS W/ REFLEX CULTURE    Collection Time: 09/03/16  5:35 AM   Result Value Ref Range    Color YELLOW/STRAW      Appearance CLEAR CLEAR      Specific gravity 1.010 1.003 - 1.030      pH (UA) 5.5 5.0 - 8.0      Protein NEGATIVE  NEG mg/dL    Glucose NEGATIVE  NEG mg/dL    Ketone NEGATIVE  NEG mg/dL    Bilirubin NEGATIVE  NEG      Blood TRACE (A) NEG      Urobilinogen 0.2 0.2 - 1.0 EU/dL    Nitrites NEGATIVE  NEG      Leukocyte Esterase NEGATIVE  NEG      WBC 0-4 0 - 4 /hpf    RBC 0-5 0 - 5 /hpf    Epithelial cells FEW FEW /lpf    Bacteria NEGATIVE  NEG /hpf    UA:UC IF INDICATED CULTURE NOT INDICATED BY UA RESULT CNI      Hyaline cast 0-2 0 - 5 /lpf   TROPONIN I    Collection Time: 09/03/16  8:25 AM   Result Value Ref Range    Troponin-I, Qt. 0.05 (H) <0.05 ng/mL       Radiologic Studies -     DUPLEX LOWER EXT VENOUS BILAT      EXAM:  DUPLEX LOWER EXT VENOUS BILAT  ??  INDICATION:  Leg swelling, pain, DVT suspected  ??  COMPARISON: None.  ??   FINDINGS: Duplex Doppler sonography of the right and left lower extremities was  performed from the groin to the calf. The  right and left common femoral, femoral  and popliteal veins are compressible with normal color-flow and wave forms and  response to physiologic maneuvers including Valsalva and augmentation. Bilateral  inguinal lymph nodes are incidentally noted. A popliteal fossa cyst on the left  measures 1.4 x 2.0 x 2.5 cm.  ??  IMPRESSION  IMPRESSION: No deep venous thrombosis identified. Left Baker's cyst.         CXR Results  (Last 48 hours)               09/03/16 0529  XR CHEST PA LAT Final result    Impression:  IMPRESSION: Normal chest.                       Narrative:  EXAM:  XR CHEST PA LAT       INDICATION:   Chest Pain       COMPARISON: None.       FINDINGS: PA and lateral radiographs of the chest demonstrate clear lungs. The   cardiac and mediastinal contours and pulmonary vascularity are normal.  The   bones and soft tissues are within normal limits.                    Medical Decision Making   I am the first provider for this patient.    I reviewed the vital signs, available nursing notes, past medical history, past surgical history, family history and social history.    Vital Signs-Reviewed the patient's vital signs.  Patient Vitals for the past 12 hrs:   Temp Pulse Resp BP SpO2   09/03/16 0815 - 77 19 106/60 92 %   09/03/16 0630 - 77 20 93/58 93 %   09/03/16 0553 - 77 14 - 96 %   09/03/16 0551 - - - 100/58 -   09/03/16 0351 98.3 ??F (36.8 ??C) 85 17 159/89 100 %     Records Reviewed: Nursing Notes and Old Medical Records    Provider Notes (Medical Decision Making):     DDx: dependent edema, DVT, CHF, cellulitis    ED Course:   Initial assessment performed. The patients presenting problems have been discussed, and they are in agreement with the care plan formulated and outlined with them.  I have encouraged them to ask questions as they arise throughout their visit.    4:23 AM   Spent 3-5 minutes discussing the risks of smoking and the benefits of smoking cessation as well as the long term sequelae of smoking with the pt who verbalized her understanding. Reviewed strategies for success, including gradually decreasing the number of cigarettes smoked a day.  Written by Donny Pique, ED Scribe, as dictated by Naylea Wigington N O'Bier, MD     EKG interpretation: (Preliminary) [phone]  Rhythm: normal sinus rhythm. Rate (approx.): 73bpm; Axis: normal; Normal PR, QRS, QTc intervals; ST/T wave: Q waves in the inferior and lateral leads; Other findings: non-ischemic.  Written by Donny Pique, ED Scribe, as dictated by Hali Balgobin N O'Bier, MD    PROGRESS NOTE:  5:46 AM  Pt reevaluated. Pt ambulatory without difficulty to and from the restroom at this time. Will continue to monitor.   Written by Donny Pique, ED Scribe, as dictated by Cecil Bixby N O'Bier, MD    SIGN OUT:  6:00 AM  Patient's presentation, labs/imaging and plan of care was reviewed with Antonieta Pert, DO as part of sign out.  They will await pending labs and  dispo as part of the plan discussed with the patient.    Antonieta Pert, DO's assistance in completion of this plan is greatly appreciated but it should be noted that I will be the provider of record for this patient.  Shakyra Mattera Jonelle Sidle, MD          Diagnosis     Clinical Impression:   1. Leg swelling    2. Dependent edema    3. Arthralgia of both lower legs    4. Baker's cyst, left    5. Alcoholic intoxication without complication ([place])        PLAN:  1.   Current Discharge Medication List        2.   Follow-up Information     Follow up With Details Comments [place], MD In 2 days  [address]  [place] [address]  [phone]          Return to ED if worse     Disposition:    DISCHARGE NOTE:  9:13 AM  The patient's results have been reviewed with family and/or caregiver. They verbally convey their understanding and agreement of the patient's  signs, symptoms, diagnosis, treatment, and prognosis. They additionally agree to follow up as recommended in the discharge instructions or to return to the Emergency Room should the patient's condition change prior to their follow-up appointment. The family and/or caregiver verbally agrees with the care-plan and all of their questions have been answered. The discharge instructions have also been provided to the them along with educational information regarding the patient's diagnosis and a list of reasons why the patient would want to return to the ER prior to their follow-up appointment should their condition change.            Attestations:    This note is prepared by Donny Pique, acting as Scribe for Polk Minor N O'Bier, MD    Hailynn Slovacek N O'Bier, MD : The scribe's documentation has been prepared under my direction and personally reviewed by me in its entirety. I confirm that the note above accurately reflects all work, treatment, procedures, and medical decision making performed by me.    This note will not be viewable in [facility].

## 2016-09-14 DIAGNOSIS — G894 Chronic pain syndrome: Secondary | ICD-10-CM | POA: Diagnosis not present

## 2016-09-14 DIAGNOSIS — M179 Osteoarthritis of knee, unspecified: Secondary | ICD-10-CM | POA: Diagnosis not present

## 2016-09-14 DIAGNOSIS — F331 Major depressive disorder, recurrent, moderate: Secondary | ICD-10-CM | POA: Diagnosis not present

## 2016-09-14 DIAGNOSIS — L209 Atopic dermatitis, unspecified: Secondary | ICD-10-CM | POA: Diagnosis not present

## 2016-09-14 DIAGNOSIS — I10 Essential (primary) hypertension: Secondary | ICD-10-CM | POA: Diagnosis not present

## 2016-10-16 DIAGNOSIS — G894 Chronic pain syndrome: Secondary | ICD-10-CM | POA: Diagnosis not present

## 2016-10-16 DIAGNOSIS — M25552 Pain in left hip: Secondary | ICD-10-CM | POA: Diagnosis not present

## 2016-10-16 DIAGNOSIS — M79604 Pain in right leg: Secondary | ICD-10-CM | POA: Diagnosis not present

## 2016-10-16 DIAGNOSIS — M79605 Pain in left leg: Secondary | ICD-10-CM | POA: Diagnosis not present

## 2016-10-16 DIAGNOSIS — M25551 Pain in right hip: Secondary | ICD-10-CM | POA: Diagnosis not present

## 2016-10-17 DIAGNOSIS — M5416 Radiculopathy, lumbar region: Secondary | ICD-10-CM | POA: Diagnosis not present

## 2016-10-17 DIAGNOSIS — G894 Chronic pain syndrome: Secondary | ICD-10-CM | POA: Diagnosis not present

## 2016-10-17 DIAGNOSIS — M545 Low back pain: Secondary | ICD-10-CM | POA: Diagnosis not present

## 2016-10-24 DIAGNOSIS — M79604 Pain in right leg: Secondary | ICD-10-CM | POA: Diagnosis not present

## 2016-10-24 DIAGNOSIS — Z23 Encounter for immunization: Secondary | ICD-10-CM | POA: Diagnosis not present

## 2016-10-24 DIAGNOSIS — G894 Chronic pain syndrome: Secondary | ICD-10-CM | POA: Diagnosis not present

## 2016-10-24 DIAGNOSIS — M25551 Pain in right hip: Secondary | ICD-10-CM | POA: Diagnosis not present

## 2016-10-24 DIAGNOSIS — M25552 Pain in left hip: Secondary | ICD-10-CM | POA: Diagnosis not present

## 2016-10-24 DIAGNOSIS — M79605 Pain in left leg: Secondary | ICD-10-CM | POA: Diagnosis not present

## 2016-12-01 DIAGNOSIS — G894 Chronic pain syndrome: Secondary | ICD-10-CM | POA: Diagnosis not present

## 2017-10-06 ENCOUNTER — Encounter (HOSPITAL_COMMUNITY): Payer: Self-pay | Admitting: Emergency Medicine

## 2017-10-06 ENCOUNTER — Emergency Department (HOSPITAL_COMMUNITY)
Admission: EM | Admit: 2017-10-06 | Discharge: 2017-10-06 | Disposition: A | Discharge status: Home / Self Care | Payer: Medicare Other | Attending: Emergency Medicine | Admitting: Emergency Medicine

## 2017-10-06 ENCOUNTER — Emergency Department (HOSPITAL_COMMUNITY): Payer: Medicare Other

## 2017-10-06 DIAGNOSIS — Z79899 Other long term (current) drug therapy: Secondary | ICD-10-CM | POA: Diagnosis not present

## 2017-10-06 DIAGNOSIS — N760 Acute vaginitis: Secondary | ICD-10-CM | POA: Diagnosis not present

## 2017-10-06 DIAGNOSIS — F17210 Nicotine dependence, cigarettes, uncomplicated: Secondary | ICD-10-CM | POA: Diagnosis not present

## 2017-10-06 DIAGNOSIS — J45909 Unspecified asthma, uncomplicated: Secondary | ICD-10-CM | POA: Insufficient documentation

## 2017-10-06 DIAGNOSIS — B356 Tinea cruris: Secondary | ICD-10-CM | POA: Insufficient documentation

## 2017-10-06 DIAGNOSIS — B9689 Other specified bacterial agents as the cause of diseases classified elsewhere: Secondary | ICD-10-CM

## 2017-10-06 DIAGNOSIS — R102 Pelvic and perineal pain: Secondary | ICD-10-CM | POA: Diagnosis present

## 2017-10-06 DIAGNOSIS — I10 Essential (primary) hypertension: Secondary | ICD-10-CM | POA: Diagnosis not present

## 2017-10-06 LAB — I-STAT BETA HCG BLOOD, ED (MC, WL, AP ONLY)

## 2017-10-06 LAB — COMPREHENSIVE METABOLIC PANEL
ALK PHOS: 109 U/L (ref 38–126)
ALT: 46 U/L — ABNORMAL HIGH (ref 0–44)
AST: 31 U/L (ref 15–41)
Albumin: 3.8 g/dL (ref 3.5–5.0)
Anion gap: 9 (ref 5–15)
BUN: 7 mg/dL (ref 6–20)
CALCIUM: 8.7 mg/dL — AB (ref 8.9–10.3)
CO2: 24 mmol/L (ref 22–32)
Chloride: 105 mmol/L (ref 98–111)
Creatinine, Ser: 0.95 mg/dL (ref 0.44–1.00)
GFR calc Af Amer: >60 mL/min (ref >60)
GFR calc non Af Amer: >60 mL/min (ref >60)
GLUCOSE: 98 mg/dL (ref 70–99)
Potassium: 4 mmol/L (ref 3.5–5.1)
SODIUM: 138 mmol/L (ref 135–145)
Total Bilirubin: 0.5 mg/dL (ref 0.3–1.2)
Total Protein: 7.5 g/dL (ref 6.5–8.1)

## 2017-10-06 LAB — URINALYSIS, ROUTINE W REFLEX MICROSCOPIC
BACTERIA UA: NONE SEEN
Bilirubin Urine: NEGATIVE
Glucose, UA: NEGATIVE mg/dL
Hgb urine dipstick: NEGATIVE
Ketones, ur: NEGATIVE mg/dL
Leukocytes, UA: NEGATIVE
Nitrite: POSITIVE — AB
Protein, ur: NEGATIVE mg/dL
SPECIFIC GRAVITY, URINE: 1.024 (ref 1.005–1.030)
pH: 5 (ref 5.0–8.0)

## 2017-10-06 LAB — CBC
HCT: 42 % (ref 36.0–46.0)
HEMOGLOBIN: 13.6 g/dL (ref 12.0–15.0)
MCH: 30.6 pg (ref 26.0–34.0)
MCHC: 32.4 g/dL (ref 30.0–36.0)
MCV: 94.6 fL (ref 78.0–100.0)
Platelets: 424 10*3/uL — ABNORMAL HIGH (ref 150–400)
RBC: 4.44 MIL/uL (ref 3.87–5.11)
RDW: 14.8 % (ref 11.5–15.5)
WBC: 12 10*3/uL — ABNORMAL HIGH (ref 4.0–10.5)

## 2017-10-06 LAB — WET PREP, GENITAL
SPERM: NONE SEEN
Trich, Wet Prep: NONE SEEN
YEAST WET PREP: NONE SEEN

## 2017-10-06 MED ORDER — FLUCONAZOLE 200 MG PO TABS: 200.0000 mg | ORAL_TABLET | Freq: Every day | ORAL | 0 refills | Status: DC

## 2017-10-06 MED ORDER — HYDROMORPHONE HCL 1 MG/ML IJ SOLN
2.0000 mg | Freq: Once | INTRAMUSCULAR | Status: AC
  Administered 2017-10-06: 2 mg via INTRAMUSCULAR
  Filled 2017-10-06: qty 2

## 2017-10-06 MED ORDER — ONDANSETRON 4 MG PO TBDP
8.0000 mg | ORAL_TABLET | Freq: Once | ORAL | Status: AC
  Administered 2017-10-06: 8 mg via ORAL
  Filled 2017-10-06: qty 2

## 2017-10-06 MED ORDER — METRONIDAZOLE 500 MG PO TABS: 500.0000 mg | ORAL_TABLET | Freq: Two times a day (BID) | ORAL | 0 refills | Status: DC

## 2017-10-06 NOTE — ED Provider Notes (Signed)
[facility] Provider Note   CSN: [id number] Arrival date & time: 10/06/17  0346     History   Chief Complaint Chief Complaint  Patient presents with  . Pelvic Pain    HPI Rebekah Murillo is a 52 y.o. female.  Patient presents to the emergency department for evaluation of pelvic pain.  She reports that this has been ongoing for months.  She tells me that she was told she would need a hysterectomy years ago, but is not sure why.  She saw her primary doctor recently for vaginal irritation and was put on a dose of Diflucan but she has persistent irritation.  Patient also reports external rash.     Past Medical History:  Diagnosis Date  . Asthma   . Bronchitis   . Eczema   . Hypertension   . Major depression   . PTSD (post-traumatic stress disorder)     Patient Active Problem List   Diagnosis Date Noted  . Acute bronchitis 05/04/2012  . Dyspnea 05/04/2012  . Pulmonary infiltrates 05/04/2012  . Chronic obstructive airway disease with asthma (HCC) 04/23/2012  . Tobacco abuse 04/23/2012  . PTSD (post-traumatic stress disorder) 04/23/2012  . Depression 04/23/2012  . Abnormal CXR (chest x-ray) 04/23/2012    Past Surgical History:  Procedure Laterality Date  . APPENDECTOMY    . BUNIONECTOMY     bilateral  . ORIF ANKLE FRACTURE Right 04/23/2012   Procedure: Right ankle fracture/non-union takedown, grafting, plating;  Surgeon: Cammy CopaGregory Scott Dean, MD;  [facility];  Service: Orthopedics;  Laterality: Right;  Right ankle fracture non-union takedown, grafting, plating      OB History   None      Home Medications    Prior to Admission medications   Medication Sig Start Date End Date Taking? Authorizing Provider  atorvastatin (LIPITOR) 40 MG tablet Take 40 mg by mouth every evening. 07/05/17  Yes [provider]  busPIRone (BUSPAR) 15 MG tablet Take 15 mg by mouth 2 (two) times daily as needed for anxiety. 07/05/17  Yes  [provider]  clotrimazole-betamethasone (LOTRISONE) cream Apply 1 application topically 2 (two) times daily as needed (rash).  09/11/17  Yes [provider]  diclofenac (VOLTAREN) 75 MG EC tablet Take 75 mg by mouth 2 (two) times daily. 09/11/17  Yes [provider]  DULoxetine (CYMBALTA) 60 MG capsule Take 60 mg by mouth every morning. 09/28/17  Yes [provider]  gabapentin (NEURONTIN) 300 MG capsule TAKE 2 CAPSULES(600 MG) BY MOUTH THREE TIMES DAILY 07/24/15  Yes Elvina SidleLauenstein, Kurt, MD  HYDROcodone-acetaminophen (NORCO) 10-325 MG tablet Take 1 tablet by mouth 3 (three) times daily. 09/28/17  Yes [provider]  ramipril (ALTACE) 2.5 MG capsule Take 1 capsule (2.5 mg total) by mouth daily. 06/20/12  Yes Ghimire, Werner LeanShanker M, MD  traZODone (DESYREL) 100 MG tablet Take 100 mg by mouth at bedtime. 07/05/17  Yes [provider]  triamcinolone cream (KENALOG) 0.1 % Apply 1 application topically 2 (two) times daily. Reported on 03/08/2015   Yes [provider]  acyclovir (ZOVIRAX) 400 MG tablet Take 1 tablet (400 mg total) by mouth 4 (four) times daily. Patient not taking: Reported on 01/26/2015 10/16/14   Teressa LowerPickering, Vrinda, NP  Clobetasol Propionate 0.05 % lotion Apply 1 application topically 2 (two) times daily. Patient not taking: Reported on 03/08/2015 02/08/15   Elvina SidleLauenstein, Kurt, MD  cloNIDine (CATAPRES) 0.1 MG tablet Take 1 tablet (0.1 mg total) by mouth 3 (  three) times daily. Patient not taking: Reported on 03/08/2015 01/18/15   Elvina Sidle, MD  HYDROcodone-acetaminophen (NORCO) 7.5-325 MG tablet Take 1 tablet by mouth every 6 (six) hours as needed for moderate pain. Patient not taking: Reported on 10/06/2017 03/08/15   Elvina Sidle, MD  loperamide (IMODIUM) 2 MG capsule Take 1 capsule (2 mg total) by mouth 4 (four) times daily as needed for diarrhea or loose stools. Patient not taking: Reported on 10/06/2017 07/28/16   Caccavale, Sophia, PA-C   methocarbamol (ROBAXIN) 500 MG tablet Take 1 tablet (500 mg total) by mouth 2 (two) times daily. Patient not taking: Reported on 10/06/2017 07/28/16   Caccavale, Sophia, PA-C  ondansetron (ZOFRAN) 4 MG tablet Take 1 tablet (4 mg total) by mouth every 8 (eight) hours as needed for nausea or vomiting. Patient not taking: Reported on 10/06/2017 07/28/16   Caccavale, Sophia, PA-C  selenium sulfide (SELSUN BLUE MEDICATED) 1 % LOTN Apply 1 application topically daily. Patient not taking: Reported on 01/26/2015 09/28/12   Trixie Dredge, PA-C    Family History Family History  Problem Relation Age of Onset  . Cancer Mother     Social History Social History   Tobacco Use  . Smoking status: Current Every Day Smoker    Packs/day: 2.00    Years: 31.00    Pack years: 62.00    Types: Cigarettes  . Smokeless tobacco: Never Used  . Tobacco comment: Patient reports trying to cut back reports was smoking 2 packs a day  Substance Use Topics  . Alcohol use: No  . Drug use: No     Allergies   Codeine; Naproxen; Penicillins; and Tramadol   Review of Systems Review of Systems  Genitourinary: Positive for pelvic pain.  Skin: Positive for rash.  All other systems reviewed and are negative.    Physical Exam Updated Vital Signs BP (!) 150/93   Pulse 78   Temp 98.8 F (37.1 C) (Oral)   Ht 5\' 6"  (1.676 m)   SpO2 99%   BMI 32.28 kg/m   Physical Exam  Constitutional: She is oriented to person, place, and time. She appears well-developed and well-nourished. No distress.  HENT:  Head: Normocephalic and atraumatic.  Right Ear: Hearing normal.  Left Ear: Hearing normal.  Nose: Nose normal.  Mouth/Throat: Oropharynx is clear and moist and mucous membranes are normal.  Eyes: Pupils are equal, round, and reactive to light. Conjunctivae and EOM are normal.  Neck: Normal range of motion. Neck supple.  Cardiovascular: Regular rhythm, S1 normal and S2 normal. Exam reveals no gallop and no friction rub.    No murmur heard. Pulmonary/Chest: Effort normal and breath sounds normal. No respiratory distress. She exhibits no tenderness.  Abdominal: Soft. Normal appearance and bowel sounds are normal. There is no hepatosplenomegaly. There is no tenderness. There is no rebound, no guarding, no tenderness at McBurney's point and negative Murphy's sign. No hernia.  Genitourinary: Uterus normal. Cervix exhibits no motion tenderness. Right adnexum displays tenderness. Right adnexum displays no mass. Left adnexum displays tenderness. Left adnexum displays no mass. Vaginal discharge (white) found.  Musculoskeletal: Normal range of motion.  Neurological: She is alert and oriented to person, place, and time. She has normal strength. No cranial nerve deficit or sensory deficit. Coordination normal. GCS eye subscore is 4. GCS verbal subscore is 5. GCS motor subscore is 6.  Skin: Skin is warm, dry and intact. No rash (Erythematous, slightly raised, confluent rash with satellite lesions in groin area) noted. No cyanosis.  Psychiatric: She has a normal mood and affect. Her speech is normal and behavior is normal. Thought content normal.  Nursing note and vitals reviewed.    ED Treatments / Results  Labs (all labs ordered are listed, but only abnormal results are displayed) Labs Reviewed  WET PREP, GENITAL - Abnormal; Notable for the following components:      Result Value   Clue Cells Wet Prep HPF POC PRESENT (*)    WBC, Wet Prep HPF POC MANY (*)    All other components within normal limits  URINALYSIS, ROUTINE W REFLEX MICROSCOPIC - Abnormal; Notable for the following components:   Color, Urine AMBER (*)    Nitrite POSITIVE (*)    All other components within normal limits  COMPREHENSIVE METABOLIC PANEL - Abnormal; Notable for the following components:   Calcium 8.7 (*)    ALT 46 (*)    All other components within normal limits  CBC - Abnormal; Notable for the following components:   WBC 12.0 (*)     Platelets 424 (*)    All other components within normal limits  I-STAT BETA HCG BLOOD, ED ([facility], [facility], [facility] ONLY)  GC/CHLAMYDIA PROBE AMP ([place]) NOT AT [facility]    EKG None  Radiology No results found.  Procedures Procedures (including critical care time)  Medications Ordered in ED Medications  HYDROmorphone (DILAUDID) injection 2 mg (2 mg Intramuscular Given 10/06/17 0614)  ondansetron (ZOFRAN-ODT) disintegrating tablet 8 mg (8 mg Oral Given 10/06/17 1610)     Initial Impression / Assessment and Plan / ED Course  I have reviewed the triage vital signs and the nursing notes.  Pertinent labs & imaging results that were available during my care of the patient were reviewed by me and considered in my medical decision making (see chart for details).     Patient is experiencing pelvic pain that has been ongoing for several months.  Examination reveals diffuse pelvic tenderness, no masses palpated.  No cervical motion tenderness.  She does have external rash that appears to be consistent with yeast.  Wet prep was positive as well.  Will perform pelvic ultrasound to ensure that she does not have any significant abnormalities, tumor etc.  Treat with Flagyl and prolonged course of Diflucan, follow-up with OB/GYN if ultrasound negative. Will sign out to oncoming ER physician to follow up ultrasound.  Final Clinical Impressions(s) / ED Diagnoses   Final diagnoses:  Pelvic pain    ED Discharge Orders    None       Pollina, Canary Brim, MD 10/06/17 [phone]

## 2017-10-06 NOTE — ED Provider Notes (Signed)
Care was taken over from Dr. Blinda LeatherwoodPollina.  Patient has had some ongoing pelvic pain.  Pelvic exam per Dr. Blinda LeatherwoodPollina showed vaginal discharge and a tinea infection.  She was awaiting a pelvic ultrasound which was negative for acute abnormality.  She was discharged with prescriptions and discharge papers per Dr. August AlbinoPollina   Bill Mcvey, MD 10/06/17 [phone]

## 2017-10-06 NOTE — ED Notes (Signed)
Patient transported to [facility] 

## 2017-10-06 NOTE — ED Triage Notes (Signed)
Pt reports abd pain and states "I feel like my insides are going to fall out.  Was seen and told she had a yeast infection but the medication is not working.  Stopped taking blood pressure medication HNT

## 2017-10-06 NOTE — ED Notes (Signed)
Patient given turkey sandwich and coffee 

## 2017-10-06 NOTE — ED Notes (Signed)
Patient verbalized understanding of discharge instructions and prescription medications and denies any further needs or questions at this time. VS stable. Patient ambulatory with steady gait.  

## 2017-10-08 LAB — GC/CHLAMYDIA PROBE AMP (~~LOC~~) NOT AT ARMC
Chlamydia: NEGATIVE
Neisseria Gonorrhea: NEGATIVE

## 2018-04-19 ENCOUNTER — Telehealth (INDEPENDENT_AMBULATORY_CARE_PROVIDER_SITE_OTHER): Payer: Self-pay

## 2018-04-19 NOTE — Telephone Encounter (Signed)
Pt was called and screened for COVID-19 and answered no to all questions

## 2018-04-22 ENCOUNTER — Ambulatory Visit (INDEPENDENT_AMBULATORY_CARE_PROVIDER_SITE_OTHER): Payer: Medicare Other | Admitting: Orthopedic Surgery

## 2018-06-04 ENCOUNTER — Ambulatory Visit: Payer: Self-pay | Admitting: Orthopedic Surgery

## 2018-06-19 ENCOUNTER — Ambulatory Visit: Payer: Medicare Other | Admitting: Orthopedic Surgery

## 2018-06-27 ENCOUNTER — Encounter: Payer: Self-pay | Admitting: Physician Assistant

## 2018-06-27 ENCOUNTER — Ambulatory Visit (INDEPENDENT_AMBULATORY_CARE_PROVIDER_SITE_OTHER): Payer: Medicare Other | Admitting: Orthopedic Surgery

## 2018-06-27 ENCOUNTER — Other Ambulatory Visit: Payer: Self-pay

## 2018-06-27 ENCOUNTER — Ambulatory Visit (INDEPENDENT_AMBULATORY_CARE_PROVIDER_SITE_OTHER): Payer: Medicare Other

## 2018-06-27 VITALS — Ht 66.0 in | Wt 200.0 lb

## 2018-06-27 DIAGNOSIS — M79672 Pain in left foot: Secondary | ICD-10-CM | POA: Diagnosis not present

## 2018-06-27 DIAGNOSIS — M2022 Hallux rigidus, left foot: Secondary | ICD-10-CM

## 2018-07-01 ENCOUNTER — Telehealth: Payer: Self-pay | Admitting: Orthopedic Surgery

## 2018-07-01 ENCOUNTER — Encounter: Payer: Self-pay | Admitting: Orthopedic Surgery

## 2018-07-01 ENCOUNTER — Other Ambulatory Visit: Payer: Self-pay | Admitting: Physician Assistant

## 2018-07-01 DIAGNOSIS — M79672 Pain in left foot: Secondary | ICD-10-CM

## 2018-07-01 MED ORDER — HYDROCODONE-ACETAMINOPHEN 5-325 MG PO TABS: 1.0000 | ORAL_TABLET | Freq: Four times a day (QID) | ORAL | 0 refills | Status: DC | PRN

## 2018-07-01 NOTE — Telephone Encounter (Signed)
Patient called and stated that she is so much pain and need something to hold her over until the surgery can get scheduled.  [phone]

## 2018-07-01 NOTE — Telephone Encounter (Signed)
Shawn please advise, thank you 

## 2018-07-01 NOTE — Progress Notes (Signed)
Okay'ed for hydrocodone/APAP 5-325mg , #20 for pain control prior to surgery per Dr. Sharol Given.   [place]

## 2018-07-01 NOTE — Progress Notes (Signed)
Office Visit Note   Patient: Rebekah HartshornRosemary Delgadillo           Date of Birth: 1965/07/20           MRN: [id number] Visit Date: 06/27/2018              Requested by: No referring provider defined for this encounter. PCP: System, Provider Not In  Chief Complaint  Patient presents with  . Left Foot - Pain    NP      HPI: Patient is a 53 year old woman who presents with pain 10/10 of the left great toe MTP joint.  Patient states she is tried therapy shoewear modification and has failed conservative therapy after her surgery 10 years ago for a bunion deformity.  She states the surgery was performed in [place].  She states the retained hardware is painful any attempted range of motion of her toe was painful.  Assessment & Plan: Visit Diagnoses:  1. Left foot pain   2. Hallux rigidus, left foot     Plan: Discussed with the patient with her collapse of the MTP joint and the varus collapse of the joint her best option is to proceed with a fusion of the left great toe MTP joint.  Risks and benefits were discussed including increased risk of the wound not healing bone not healing as well as increased risk of infection with her smoking.  Discussed the importance of smoking cessation she states she does not have sleep apnea and has been tested for this.  Plan for outpatient surgery for fusion of the great toe MTP joint.  Follow-Up Instructions: Return in about 1 week (around 07/04/2018).   Ortho Exam  Patient is alert, oriented, no adenopathy, well-dressed, normal affect, normal respiratory effort. Examination patient has a good dorsalis pedis and posterior tibial pulse.  There is varus collapse of the great toe with pain with attempted range of motion of the MTP joint.  There is no cellulitis no ulcers no signs of infection.  Radiographs shows complete collapse of the MTP joint with osteophytic bone spurs.  Imaging: No results found. No images are attached to the encounter.  Labs: Lab Results   Component Value Date   REPTSTATUS 11/17/2010 FINAL 11/15/2010   CULT  11/15/2010    Multiple bacterial morphotypes present, none predominant. Suggest appropriate recollection if clinically indicated.     Lab Results  Component Value Date   ALBUMIN 3.8 10/06/2017   ALBUMIN 4.3 07/11/2012   ALBUMIN 3.6 03/03/2011    Body mass index is 32.28 kg/m.  Orders:  Orders Placed This Encounter  Procedures  . XR Foot Complete Left   No orders of the defined types were placed in this encounter.    Procedures: No procedures performed  Clinical Data: No additional findings.  ROS:  All other systems negative, except as noted in the HPI. Review of Systems  Objective: Vital Signs: Ht 5\' 6"  (1.676 m)   Wt 200 lb (90.7 kg)   BMI 32.28 kg/m   Specialty Comments:  No specialty comments available.  PMFS History: Patient Active Problem List   Diagnosis Date Noted  . Acute bronchitis 05/04/2012  . Dyspnea 05/04/2012  . Pulmonary infiltrates 05/04/2012  . Chronic obstructive airway disease with asthma (HCC) 04/23/2012  . Tobacco abuse 04/23/2012  . PTSD (post-traumatic stress disorder) 04/23/2012  . Depression 04/23/2012  . Abnormal CXR (chest x-ray) 04/23/2012   Past Medical History:  Diagnosis Date  . Asthma   . Bronchitis   .  Eczema   . Hypertension   . Major depression   . PTSD (post-traumatic stress disorder)     Family History  Problem Relation Age of Onset  . Cancer Mother     Past Surgical History:  Procedure Laterality Date  . APPENDECTOMY    . BUNIONECTOMY     bilateral  . ORIF ANKLE FRACTURE Right 04/23/2012   Procedure: Right ankle fracture/non-union takedown, grafting, plating;  Surgeon: Meredith Pel, MD;  Location: [facility];  Service: Orthopedics;  Laterality: Right;  Right ankle fracture non-union takedown, grafting, plating    Social History   Occupational History  . Not on file  Tobacco Use  . Smoking status: Current Every Day Smoker     Packs/day: 2.00    Years: 31.00    Pack years: 62.00    Types: Cigarettes  . Smokeless tobacco: Never Used  . Tobacco comment: Patient reports trying to cut back reports was smoking 2 packs a day  Substance and Sexual Activity  . Alcohol use: No  . Drug use: No  . Sexual activity: Not on file

## 2018-07-01 NOTE — Telephone Encounter (Signed)
Sent Rx for hydrocodone 5mg  #20 tablets for pain control prior to surgery as approved by Dr. Sharol Given.

## 2018-07-01 NOTE — Telephone Encounter (Signed)
Patient was called and informed that her Rx was sent over.

## 2018-07-01 NOTE — Telephone Encounter (Signed)
Patient wants RX sent to [facility]

## 2018-07-10 ENCOUNTER — Telehealth: Payer: Self-pay | Admitting: Orthopedic Surgery

## 2018-07-10 DIAGNOSIS — M79672 Pain in left foot: Secondary | ICD-10-CM

## 2018-07-10 MED ORDER — HYDROCODONE-ACETAMINOPHEN 5-325 MG PO TABS: 1.0000 | ORAL_TABLET | Freq: Three times a day (TID) | ORAL | 0 refills | Status: DC | PRN

## 2018-07-10 NOTE — Telephone Encounter (Signed)
Erin please advise, thank you.  

## 2018-07-10 NOTE — Telephone Encounter (Signed)
Patient called needing Rx refilled (Hydrocodone) The number to contact patient is [phone]

## 2018-07-10 NOTE — Telephone Encounter (Signed)
refilled 

## 2018-07-15 ENCOUNTER — Telehealth: Payer: Self-pay | Admitting: Orthopedic Surgery

## 2018-07-15 NOTE — Telephone Encounter (Signed)
Need to wait until after surgery for pain med otherwise the med will not work after surgery

## 2018-07-15 NOTE — Telephone Encounter (Signed)
Patient called needing Rx refilled (Hydrocodone) Patient uses [facility] on [address]   Patient asked for a call when the Rx is sent over to the pharmacy? The number to contact patient is [phone]

## 2018-07-15 NOTE — Telephone Encounter (Signed)
Pt is requesting refill on Hydrocodone. She is scheduled for a hardware removal GT on 07/23/18. The pt has had medication on 07/01/18 # 20 and 07/10/18 #21 please advise.

## 2018-07-15 NOTE — Telephone Encounter (Signed)
2nd call from patient requesting refill of pain medication:   Patient called needing Rx refilled (Hydrocodone) Patient uses [facility] on [address]   Patient asked for a call when the Rx is sent over to the pharmacy? The number to contact patient is [phone]

## 2018-07-15 NOTE — Telephone Encounter (Signed)
Patient is taking Hydrocodone 1 every 4 hours.  Can Rx be changed to that?  Also, wants sent to [facility] at [facility].  Please call patient when med sent in to pharmacy.  [phone]

## 2018-07-15 NOTE — Telephone Encounter (Signed)
Message sent to Dr. Sharol Given about request. Will sign off on this message pending advisement from first message.

## 2018-07-16 ENCOUNTER — Encounter (HOSPITAL_BASED_OUTPATIENT_CLINIC_OR_DEPARTMENT_OTHER): Payer: Self-pay | Admitting: *Deleted

## 2018-07-16 ENCOUNTER — Other Ambulatory Visit: Payer: Self-pay

## 2018-07-16 NOTE — Telephone Encounter (Signed)
Patient was called and advised of the message below and insisted that it helps her for pain. Patient understood that Dr Sharol Given will not be prescribing any pain med's at this time.

## 2018-07-18 ENCOUNTER — Telehealth: Payer: Self-pay

## 2018-07-18 ENCOUNTER — Telehealth: Payer: Self-pay | Admitting: Orthopedic Surgery

## 2018-07-18 NOTE — Telephone Encounter (Signed)
Patient was already notified about this, Dr Sharol Given will not administer her any medications for pain until surgery.

## 2018-07-18 NOTE — Telephone Encounter (Signed)
Patient called stating that she needs the doctor to reconsider giving her something for pain. She reports she is having severe pain and needs some help. Please call to further advise. [phone]

## 2018-07-18 NOTE — Telephone Encounter (Signed)
I called pt and advised per Dr. Sharol Given that he can not refill her pain medication at this time. Advised that if she continues to take prior to procedure then after surgery it will not be able to control her pain. Pt advised that she may go to the emergency room because she is hurting so bad. I advised that she should reduce activity that she should elevate and take OTC pain med as directed per package instructions and that we can not move up her surgery but it is sch for next week.

## 2018-07-18 NOTE — Telephone Encounter (Signed)
Pt called in said she is in a lot of pain and is requestion pain medication to help ease the pain.  [phone]

## 2018-07-19 ENCOUNTER — Encounter (HOSPITAL_BASED_OUTPATIENT_CLINIC_OR_DEPARTMENT_OTHER)
Admission: RE | Admit: 2018-07-19 | Discharge: 2018-07-19 | Disposition: A | Discharge status: Home / Self Care | Payer: Medicare Other | Source: Ambulatory Visit | Attending: Orthopedic Surgery | Admitting: Orthopedic Surgery

## 2018-07-19 ENCOUNTER — Other Ambulatory Visit: Payer: Self-pay

## 2018-07-19 ENCOUNTER — Other Ambulatory Visit (HOSPITAL_COMMUNITY)
Admission: RE | Admit: 2018-07-19 | Discharge: 2018-07-19 | Disposition: A | Discharge status: Home / Self Care | Payer: Medicare Other | Source: Ambulatory Visit | Attending: Orthopedic Surgery | Admitting: Orthopedic Surgery

## 2018-07-19 DIAGNOSIS — Z1159 Encounter for screening for other viral diseases: Secondary | ICD-10-CM | POA: Diagnosis present

## 2018-07-19 DIAGNOSIS — M2022 Hallux rigidus, left foot: Secondary | ICD-10-CM | POA: Diagnosis present

## 2018-07-19 DIAGNOSIS — F17210 Nicotine dependence, cigarettes, uncomplicated: Secondary | ICD-10-CM | POA: Diagnosis not present

## 2018-07-19 DIAGNOSIS — F419 Anxiety disorder, unspecified: Secondary | ICD-10-CM | POA: Diagnosis not present

## 2018-07-19 DIAGNOSIS — T8484XA Pain due to internal orthopedic prosthetic devices, implants and grafts, initial encounter: Secondary | ICD-10-CM | POA: Diagnosis not present

## 2018-07-19 DIAGNOSIS — M205X2 Other deformities of toe(s) (acquired), left foot: Secondary | ICD-10-CM | POA: Diagnosis not present

## 2018-07-19 DIAGNOSIS — Z880 Allergy status to penicillin: Secondary | ICD-10-CM | POA: Diagnosis not present

## 2018-07-19 DIAGNOSIS — X58XXXA Exposure to other specified factors, initial encounter: Secondary | ICD-10-CM | POA: Diagnosis not present

## 2018-07-19 DIAGNOSIS — I10 Essential (primary) hypertension: Secondary | ICD-10-CM | POA: Diagnosis not present

## 2018-07-19 DIAGNOSIS — F329 Major depressive disorder, single episode, unspecified: Secondary | ICD-10-CM | POA: Diagnosis not present

## 2018-07-19 DIAGNOSIS — Z809 Family history of malignant neoplasm, unspecified: Secondary | ICD-10-CM | POA: Diagnosis not present

## 2018-07-19 DIAGNOSIS — Z885 Allergy status to narcotic agent status: Secondary | ICD-10-CM | POA: Diagnosis not present

## 2018-07-19 DIAGNOSIS — L309 Dermatitis, unspecified: Secondary | ICD-10-CM | POA: Diagnosis not present

## 2018-07-19 DIAGNOSIS — J449 Chronic obstructive pulmonary disease, unspecified: Secondary | ICD-10-CM | POA: Diagnosis not present

## 2018-07-19 DIAGNOSIS — Z888 Allergy status to other drugs, medicaments and biological substances status: Secondary | ICD-10-CM | POA: Diagnosis not present

## 2018-07-19 DIAGNOSIS — F4310 Post-traumatic stress disorder, unspecified: Secondary | ICD-10-CM | POA: Diagnosis not present

## 2018-07-19 LAB — SARS CORONAVIRUS 2 (TAT 6-24 HRS): SARS Coronavirus 2: NEGATIVE

## 2018-07-19 NOTE — Progress Notes (Signed)
Pt given ENSURE drink with instructions for DOS. NPO otherwse. Pt. Verbalized understanding

## 2018-07-22 ENCOUNTER — Telehealth: Payer: Self-pay

## 2018-07-22 ENCOUNTER — Other Ambulatory Visit: Payer: Self-pay | Admitting: Orthopedic Surgery

## 2018-07-22 MED ORDER — OXYCODONE-ACETAMINOPHEN 5-325 MG PO TABS: 1.0000 | ORAL_TABLET | ORAL | 0 refills | Status: DC | PRN

## 2018-07-22 NOTE — Telephone Encounter (Signed)
rx sent for percocet

## 2018-07-22 NOTE — Telephone Encounter (Signed)
Pt s sch for a GT fusion tomorrow and is wanting to get her rx for pain medication filled today. Please advise.

## 2018-07-22 NOTE — Telephone Encounter (Signed)
I called and sw pt to advise that the rx has been sent to her pharmacy to call with any questions.

## 2018-07-23 ENCOUNTER — Ambulatory Visit (HOSPITAL_BASED_OUTPATIENT_CLINIC_OR_DEPARTMENT_OTHER): Payer: Medicare Other | Admitting: Certified Registered"

## 2018-07-23 ENCOUNTER — Encounter (HOSPITAL_BASED_OUTPATIENT_CLINIC_OR_DEPARTMENT_OTHER): Payer: Self-pay

## 2018-07-23 ENCOUNTER — Other Ambulatory Visit: Payer: Self-pay

## 2018-07-23 ENCOUNTER — Ambulatory Visit (HOSPITAL_BASED_OUTPATIENT_CLINIC_OR_DEPARTMENT_OTHER)
Admission: RE | Admit: 2018-07-23 | Discharge: 2018-07-23 | Disposition: A | Discharge status: Home / Self Care | Payer: Medicare Other | Attending: Orthopedic Surgery | Admitting: Orthopedic Surgery

## 2018-07-23 ENCOUNTER — Encounter (HOSPITAL_BASED_OUTPATIENT_CLINIC_OR_DEPARTMENT_OTHER)
Admission: RE | Disposition: A | Discharge status: Home / Self Care | Payer: Self-pay | Source: Home / Self Care | Attending: Orthopedic Surgery

## 2018-07-23 DIAGNOSIS — T85848A Pain due to other internal prosthetic devices, implants and grafts, initial encounter: Secondary | ICD-10-CM | POA: Diagnosis not present

## 2018-07-23 DIAGNOSIS — T8484XA Pain due to internal orthopedic prosthetic devices, implants and grafts, initial encounter: Secondary | ICD-10-CM | POA: Diagnosis not present

## 2018-07-23 DIAGNOSIS — Z880 Allergy status to penicillin: Secondary | ICD-10-CM | POA: Insufficient documentation

## 2018-07-23 DIAGNOSIS — Z885 Allergy status to narcotic agent status: Secondary | ICD-10-CM | POA: Insufficient documentation

## 2018-07-23 DIAGNOSIS — Z888 Allergy status to other drugs, medicaments and biological substances status: Secondary | ICD-10-CM | POA: Insufficient documentation

## 2018-07-23 DIAGNOSIS — F419 Anxiety disorder, unspecified: Secondary | ICD-10-CM | POA: Insufficient documentation

## 2018-07-23 DIAGNOSIS — F4310 Post-traumatic stress disorder, unspecified: Secondary | ICD-10-CM | POA: Insufficient documentation

## 2018-07-23 DIAGNOSIS — J449 Chronic obstructive pulmonary disease, unspecified: Secondary | ICD-10-CM | POA: Insufficient documentation

## 2018-07-23 DIAGNOSIS — Z809 Family history of malignant neoplasm, unspecified: Secondary | ICD-10-CM | POA: Insufficient documentation

## 2018-07-23 DIAGNOSIS — M2022 Hallux rigidus, left foot: Secondary | ICD-10-CM

## 2018-07-23 DIAGNOSIS — I10 Essential (primary) hypertension: Secondary | ICD-10-CM | POA: Insufficient documentation

## 2018-07-23 DIAGNOSIS — X58XXXA Exposure to other specified factors, initial encounter: Secondary | ICD-10-CM | POA: Insufficient documentation

## 2018-07-23 DIAGNOSIS — M205X2 Other deformities of toe(s) (acquired), left foot: Secondary | ICD-10-CM | POA: Insufficient documentation

## 2018-07-23 DIAGNOSIS — L309 Dermatitis, unspecified: Secondary | ICD-10-CM | POA: Insufficient documentation

## 2018-07-23 DIAGNOSIS — F17210 Nicotine dependence, cigarettes, uncomplicated: Secondary | ICD-10-CM | POA: Insufficient documentation

## 2018-07-23 DIAGNOSIS — F329 Major depressive disorder, single episode, unspecified: Secondary | ICD-10-CM | POA: Insufficient documentation

## 2018-07-23 HISTORY — DX: Opioid use, unspecified, uncomplicated: F11.90

## 2018-07-23 HISTORY — DX: Nicotine dependence, unspecified, uncomplicated: F17.200

## 2018-07-23 HISTORY — PX: ARTHRODESIS METATARSALPHALANGEAL JOINT (MTPJ): SHX6566

## 2018-07-23 HISTORY — PX: HARDWARE REMOVAL: SHX979

## 2018-07-23 HISTORY — DX: Chronic obstructive pulmonary disease, unspecified: J44.9

## 2018-07-23 LAB — BASIC METABOLIC PANEL
Anion gap: 7 (ref 5–15)
BUN: 11 mg/dL (ref 6–20)
CO2: 26 mmol/L (ref 22–32)
Calcium: 9 mg/dL (ref 8.9–10.3)
Chloride: 103 mmol/L (ref 98–111)
Creatinine, Ser: 0.8 mg/dL (ref 0.44–1.00)
GFR calc Af Amer: >60 mL/min (ref >60)
GFR calc non Af Amer: >60 mL/min (ref >60)
Glucose, Bld: 82 mg/dL (ref 70–99)
Potassium: 4.1 mmol/L (ref 3.5–5.1)
Sodium: 136 mmol/L (ref 135–145)

## 2018-07-23 LAB — CBC
HCT: 39.1 % (ref 36.0–46.0)
Hemoglobin: 13.1 g/dL (ref 12.0–15.0)
MCH: 30.6 pg (ref 26.0–34.0)
MCHC: 33.5 g/dL (ref 30.0–36.0)
MCV: 91.4 fL (ref 80.0–100.0)
Platelets: 292 10*3/uL (ref 150–400)
RBC: 4.28 MIL/uL (ref 3.87–5.11)
RDW: 13.6 % (ref 11.5–15.5)
WBC: 10.3 10*3/uL (ref 4.0–10.5)
nRBC: 0 % (ref 0.0–0.2)

## 2018-07-23 SURGERY — REMOVAL, HARDWARE
Anesthesia: General | Site: Foot | Laterality: Left

## 2018-07-23 MED ORDER — MEPERIDINE HCL 25 MG/ML IJ SOLN: 6.2500 mg | INTRAMUSCULAR | Status: DC | PRN

## 2018-07-23 MED ORDER — CLINDAMYCIN PHOSPHATE 900 MG/50ML IV SOLN
900.0000 mg | INTRAVENOUS | Status: AC
  Administered 2018-07-23: 900 mg via INTRAVENOUS

## 2018-07-23 MED ORDER — FENTANYL CITRATE (PF) 100 MCG/2ML IJ SOLN
50.0000 ug | INTRAMUSCULAR | Status: AC | PRN
  Administered 2018-07-23: 08:00:00 100 ug via INTRAVENOUS
  Administered 2018-07-23 (×2): 50 ug via INTRAVENOUS

## 2018-07-23 MED ORDER — CHLORHEXIDINE GLUCONATE 4 % EX LIQD: 60.0000 mL | Freq: Once | CUTANEOUS | Status: DC

## 2018-07-23 MED ORDER — FENTANYL CITRATE (PF) 100 MCG/2ML IJ SOLN
INTRAMUSCULAR | Status: AC
  Filled 2018-07-23: qty 2

## 2018-07-23 MED ORDER — ONDANSETRON HCL 4 MG/2ML IJ SOLN
INTRAMUSCULAR | Status: DC | PRN
  Administered 2018-07-23: 4 mg via INTRAVENOUS

## 2018-07-23 MED ORDER — OXYCODONE HCL 5 MG/5ML PO SOLN: 5.0000 mg | Freq: Once | ORAL | Status: DC | PRN

## 2018-07-23 MED ORDER — ROPIVACAINE HCL 5 MG/ML IJ SOLN
INTRAMUSCULAR | Status: DC | PRN
  Administered 2018-07-23: 30 mL via PERINEURAL

## 2018-07-23 MED ORDER — CLINDAMYCIN PHOSPHATE 900 MG/50ML IV SOLN
INTRAVENOUS | Status: AC
  Filled 2018-07-23: qty 50

## 2018-07-23 MED ORDER — MIDAZOLAM HCL 2 MG/2ML IJ SOLN
1.0000 mg | INTRAMUSCULAR | Status: DC | PRN
  Administered 2018-07-23: 2 mg via INTRAVENOUS

## 2018-07-23 MED ORDER — LACTATED RINGERS IV SOLN
INTRAVENOUS | Status: DC
  Administered 2018-07-23: 08:00:00 via INTRAVENOUS

## 2018-07-23 MED ORDER — MIDAZOLAM HCL 2 MG/2ML IJ SOLN
INTRAMUSCULAR | Status: AC
  Filled 2018-07-23: qty 2

## 2018-07-23 MED ORDER — PROMETHAZINE HCL 25 MG/ML IJ SOLN: 6.2500 mg | INTRAMUSCULAR | Status: DC | PRN

## 2018-07-23 MED ORDER — OXYCODONE HCL 5 MG PO TABS: 5.0000 mg | ORAL_TABLET | Freq: Once | ORAL | Status: DC | PRN

## 2018-07-23 MED ORDER — SCOPOLAMINE 1 MG/3DAYS TD PT72: 1.0000 | MEDICATED_PATCH | Freq: Once | TRANSDERMAL | Status: DC

## 2018-07-23 MED ORDER — PROPOFOL 500 MG/50ML IV EMUL
INTRAVENOUS | Status: DC | PRN
  Administered 2018-07-23: 100 ug/kg/min via INTRAVENOUS

## 2018-07-23 MED ORDER — HYDROMORPHONE HCL 1 MG/ML IJ SOLN: 0.2500 mg | INTRAMUSCULAR | Status: DC | PRN

## 2018-07-23 SURGICAL SUPPLY — 63 items
BIT DRILL 100X2XQC STRL (BIT) IMPLANT
BIT DRILL QC 2.0X100 (BIT) ×2
BIT DRL 100X2XQC STRL (BIT) ×1
BLADE AVERAGE 25MMX9MM (BLADE)
BLADE AVERAGE 25X9 (BLADE) IMPLANT
BLADE OSC/SAG .038X5.5 CUT EDG (BLADE) ×3 IMPLANT
BLADE SURG 10 STRL SS (BLADE) IMPLANT
BLADE SURG 15 STRL LF DISP TIS (BLADE) ×1 IMPLANT
BLADE SURG 15 STRL SS (BLADE) ×2
BNDG COHESIVE 4X5 TAN STRL (GAUZE/BANDAGES/DRESSINGS) ×3 IMPLANT
BNDG ESMARK 4X9 LF (GAUZE/BANDAGES/DRESSINGS) ×3 IMPLANT
CAP PIN ORTHO PINK (CAP) IMPLANT
CAP PIN PROTECTOR ORTHO WHT (CAP) IMPLANT
CORD BIPOLAR FORCEPS 12FT (ELECTRODE) IMPLANT
COVER BACK TABLE REUSABLE LG (DRAPES) ×3 IMPLANT
COVER WAND RF STERILE (DRAPES) IMPLANT
DECANTER SPIKE VIAL GLASS SM (MISCELLANEOUS) IMPLANT
DRAPE EXTREMITY T 121X128X90 (DISPOSABLE) ×3 IMPLANT
DRAPE U-SHAPE 47X51 STRL (DRAPES) ×3 IMPLANT
DRSG EMULSION OIL 3X3 NADH (GAUZE/BANDAGES/DRESSINGS) ×3 IMPLANT
DURAPREP 26ML APPLICATOR (WOUND CARE) ×3 IMPLANT
ELECT REM PT RETURN 9FT ADLT (ELECTROSURGICAL) ×3
ELECTRODE REM PT RTRN 9FT ADLT (ELECTROSURGICAL) ×1 IMPLANT
GAUZE 4X4 16PLY RFD (DISPOSABLE) IMPLANT
GAUZE SPONGE 4X4 12PLY STRL (GAUZE/BANDAGES/DRESSINGS) ×3 IMPLANT
GLOVE BIOGEL PI IND STRL 8.5 (GLOVE) ×1 IMPLANT
GLOVE BIOGEL PI INDICATOR 8.5 (GLOVE) ×2
GLOVE SURG ORTHO 8.5 STRL (GLOVE) ×3 IMPLANT
GLOVE SURG ORTHO 9.0 STRL STRW (GLOVE) ×3 IMPLANT
GOWN STRL REUS W/ TWL LRG LVL3 (GOWN DISPOSABLE) ×1 IMPLANT
GOWN STRL REUS W/ TWL XL LVL3 (GOWN DISPOSABLE) ×1 IMPLANT
GOWN STRL REUS W/TWL LRG LVL3 (GOWN DISPOSABLE) ×2
GOWN STRL REUS W/TWL XL LVL3 (GOWN DISPOSABLE) ×2
K-WIRE .045X4 (WIRE) IMPLANT
K-WIRE .062X4 (WIRE) IMPLANT
NEEDLE HYPO 22GX1.5 SAFETY (NEEDLE) IMPLANT
NS IRRIG 1000ML POUR BTL (IV SOLUTION) ×3 IMPLANT
PACK BASIN DAY SURGERY FS (CUSTOM PROCEDURE TRAY) ×3 IMPLANT
PAD CAST 4YDX4 CTTN HI CHSV (CAST SUPPLIES) ×1 IMPLANT
PADDING CAST ABS 4INX4YD NS (CAST SUPPLIES) ×2
PADDING CAST ABS COTTON 4X4 ST (CAST SUPPLIES) ×1 IMPLANT
PADDING CAST COTTON 4X4 STRL (CAST SUPPLIES) ×2
PENCIL BUTTON HOLSTER BLD 10FT (ELECTRODE) ×3 IMPLANT
PLATE LOCK VA-LCP 2.7X42 (Plate) ×2 IMPLANT
SCREW CORTEX 2.7 SLF-TPNG 16MM (Screw) ×2 IMPLANT
SCREW LOCK VA ST 2.7X14 (Screw) ×4 IMPLANT
SCREW LOCKING 2.7X16MM VA (Screw) ×4 IMPLANT
SCREW SELF TAP 12M (Screw) ×2 IMPLANT
SPONGE LAP 4X18 RFD (DISPOSABLE) ×3 IMPLANT
STOCKINETTE 6  STRL (DRAPES) ×2
STOCKINETTE 6 STRL (DRAPES) ×1 IMPLANT
SUT ETHILON 2 0 FSLX (SUTURE) IMPLANT
SUT ETHILON 3 0 FSLX (SUTURE) IMPLANT
SUT ETHILON 5 0 PS 2 18 (SUTURE) IMPLANT
SUT FIBERWIRE 3-0 18 TAPR NDL (SUTURE)
SUT VIC AB 2-0 CT1 27 (SUTURE)
SUT VIC AB 2-0 CT1 TAPERPNT 27 (SUTURE) IMPLANT
SUT VICRYL 4-0 PS2 18IN ABS (SUTURE) IMPLANT
SUTURE FIBERWR 3-0 18 TAPR NDL (SUTURE) IMPLANT
SYR BULB 3OZ (MISCELLANEOUS) ×3 IMPLANT
SYR CONTROL 10ML LL (SYRINGE) IMPLANT
TOWEL GREEN STERILE FF (TOWEL DISPOSABLE) ×6 IMPLANT
UNDERPAD 30X30 (UNDERPADS AND DIAPERS) ×3 IMPLANT

## 2018-07-23 NOTE — H&P (Signed)
Catarina HartshornRosemary Echevarria is an 53 y.o. female.   Chief Complaint: pain left great toe MTP joint HPI: Patient is a 53 year old woman who presents with pain 10/10 of the left great toe MTP joint.  Patient states she is tried therapy shoewear modification and has failed conservative therapy after her surgery 10 years ago for a bunion deformity.  She states the surgery was performed in [place].  She states the retained hardware is painful any attempted range of motion of her toe was painful.  Past Medical History:  Diagnosis Date  . Asthma   . Bronchitis   . Chronic, continuous use of opioids   . COPD (chronic obstructive pulmonary disease) (HCC)   . Eczema   . Heavy smoker    2ppd  . Hypertension   . Major depression   . PTSD (post-traumatic stress disorder)     Past Surgical History:  Procedure Laterality Date  . APPENDECTOMY    . BUNIONECTOMY     bilateral  . ORIF ANKLE FRACTURE Right 04/23/2012   Procedure: Right ankle fracture/non-union takedown, grafting, plating;  Surgeon: Cammy CopaGregory Scott Dean, MD;  [facility];  Service: Orthopedics;  Laterality: Right;  Right ankle fracture non-union takedown, grafting, plating     Family History  Problem Relation Age of Onset  . Cancer Mother    Social History:  reports that she has been smoking cigarettes. She has a 62.00 pack-year smoking history. She has never used smokeless tobacco. She reports that she does not drink alcohol or use drugs.  Allergies:  Allergies  Allergen Reactions  . Codeine Hives  . Naproxen Nausea And Vomiting  . Penicillins Hives and Nausea And Vomiting    Has patient had a PCN reaction causing immediate rash, facial/tongue/throat swelling, SOB or lightheadedness with hypotension: No Has patient had a PCN reaction causing severe rash involving mucus membranes or skin necrosis: No Has patient had a PCN reaction that required hospitalization: No Has patient had a PCN reaction occurring within the last 10 years: No If  all of the above answers are "NO", then may proceed with Cephalosporin use.   . Tramadol     Seizure, does not like the way it makes her feel    No medications prior to admission.    No results found for this or any previous visit (from the past 48 hour(s)). No results found.  Review of Systems  All other systems reviewed and are negative.   Height 5\' 6"  (1.676 m), weight 81.8 kg. Physical Exam  Patient is alert, oriented, no adenopathy, well-dressed, normal affect, normal respiratory effort. Examination patient has a good dorsalis pedis and posterior tibial pulse.  There is varus collapse of the great toe with pain with attempted range of motion of the MTP joint.  There is no cellulitis no ulcers no signs of infection.  Radiographs shows complete collapse of the MTP joint with osteophytic bone spurs. Assessment/Plan 1. Left foot pain   2. Hallux rigidus, left foot     Plan: Discussed with the patient with her collapse of the MTP joint and the varus collapse of the joint her best option is to proceed with a fusion of the left great toe MTP joint.  Risks and benefits were discussed including increased risk of the wound not healing bone not healing as well as increased risk of infection with her smoking.  Discussed the importance of smoking cessation she states she does not have sleep apnea and has been tested for this.  Plan  for outpatient surgery for fusion of the great toe MTP joint.    Newt Minion, MD 07/23/2018, 6:45 AM

## 2018-07-23 NOTE — Transfer of Care (Signed)
Immediate Anesthesia Transfer of Care Note  Patient: Rebekah Murillo  Procedure(s) Performed: LEFT GREAT TOE REMOVAL HARDWARE (Left Foot) FUSION LEFT GREAT TOE METATARSALPHALANGEAL JOINT (MTPJ) (Left Foot)  Patient Location: PACU  Anesthesia Type:MAC combined with regional for post-op pain  Level of Consciousness: awake, alert , oriented and patient cooperative  Airway & Oxygen Therapy: Patient Spontanous Breathing and Patient connected to nasal cannula oxygen  Post-op Assessment: Report given to RN and Post -op Vital signs reviewed and stable  Post vital signs: Reviewed and stable  Last Vitals:  Vitals Value Taken Time  BP 148/82 07/23/18 1145  Temp 36.8 C 07/23/18 1145  Pulse 56 07/23/18 1145  Resp 16 07/23/18 1145  SpO2 100 % 07/23/18 1145    Last Pain:  Vitals:   07/23/18 1145  TempSrc:   PainSc: 0-No pain      Patients Stated Pain Goal: 8 (16/10/96 0454)  Complications: No apparent anesthesia complications

## 2018-07-23 NOTE — Op Note (Signed)
07/23/2018  10:30 AM  PATIENT:  Rebekah Murillo    PRE-OPERATIVE DIAGNOSIS:  Hallux Rigidus Left Great Toe with Varus Collapse with painful deep retained hardware  POST-OPERATIVE DIAGNOSIS:  Same  PROCEDURE:  LEFT GREAT TOE REMOVAL HARDWARE, FUSION LEFT GREAT TOE METATARSALPHALANGEAL JOINT (MTPJ) C arm fluoroscopy to verify alignment.  SURGEON:  Newt Minion, MD  PHYSICIAN ASSISTANT:None ANESTHESIA:   General  PREOPERATIVE INDICATIONS:  Rebekah Murillo is a  53 y.o. female with a diagnosis of Hallux Rigidus Left Great Toe with Varus Collapse who failed conservative measures and elected for surgical management.    The risks benefits and alternatives were discussed with the patient preoperatively including but not limited to the risks of infection, bleeding, nerve injury, cardiopulmonary complications, the need for revision surgery, among others, and the patient was willing to proceed.  OPERATIVE IMPLANTS: 5 degree dorsal Synthes plate.  @ENCIMAGES @  OPERATIVE FINDINGS: Extensive degenerative changes of the MTP joint.  OPERATIVE PROCEDURE: Patient was brought the operating room after undergoing a popliteal block.  After adequate levels of anesthesia were obtained patient's left lower extremity was prepped using DuraPrep draped into a sterile field a timeout was called.  A curvilinear medial incision was made at the MTP joint to follow her previous incision.  This was carried sharply down to bone.  There were 2 mini frag screws that were overgrown with bone.  Oscillating saw was used to remove the bony overgrowth.  The screw heads were stripped and the screw removal set was used to remove the screws without complications x2.  Rebekah Murillo was then further used to debride the osteophytic bone spurs from the MTP joint.  A guidewire was inserted into the first metatarsal head this was then reamed with the cup reamer 18 mm in diameter.  Guidewire was then placed in the proximal phalanx and this was  reamed with a cone reamer 18 mm.  The sole and rondure were used to remove other bony spurs the wound was irrigated with normal saline the MTP joint was reduced a dorsal plate was applied and 2 compression screws were placed proximal and distal the alignment was checked and then 2 locking screws were placed both proximally and distally.  C-arm fluoroscopy verified alignment.  The wound was irrigated with normal saline.  The incision was closed using 2-0 nylon and a sterile dressing was applied patient was taken the PACU in stable condition.  Clindamycin preoperative antibiotics.   DISCHARGE PLANNING:  Antibiotic duration: Preoperative clindamycin  Weightbearing: Ideally nonweightbearing touchdown weightbearing with postop shoe  Pain medication: Prescription was called in yesterday for Percocet  Dressing care/ Wound VAC: Leave dressing clean and dry until follow-up in the office  Ambulatory devices: Walker  Discharge to: Home  Follow-up: In the office 1 week post operative.

## 2018-07-23 NOTE — Discharge Instructions (Signed)

## 2018-07-23 NOTE — Anesthesia Procedure Notes (Signed)
Procedure Name: MAC Date/Time: 07/23/2018 9:45 AM Performed by: Signe Colt, CRNA Pre-anesthesia Checklist: Patient identified, Emergency Drugs available, Suction available, Patient being monitored and Timeout performed Patient Re-evaluated:Patient Re-evaluated prior to induction Oxygen Delivery Method: Simple face mask

## 2018-07-23 NOTE — Anesthesia Procedure Notes (Signed)
Anesthesia Regional Block: Popliteal block   Pre-Anesthetic Checklist: ,, timeout performed, Correct Patient, Correct Site, Correct Laterality, Correct Procedure, Correct Position, site marked, Risks and benefits discussed,  Surgical consent,  Pre-op evaluation,  At surgeon's request and post-op pain management  Laterality: Left  Prep: chloraprep       Needles:  Injection technique: Single-shot  Needle Type: Stimiplex     Needle Length: 9cm  Needle Gauge: 21     Additional Needles:   Procedures:,,,, ultrasound used (permanent image in chart),,,,  Narrative:  Start time: 07/23/2018 8:19 AM End time: 07/23/2018 8:24 AM Injection made incrementally with aspirations every 5 mL.  Performed by: Personally  Anesthesiologist: Lynda Rainwater, MD

## 2018-07-23 NOTE — Progress Notes (Signed)
Assisted Dr. Miller with left, ultrasound guided, popliteal block. Side rails up, monitors on throughout procedure. See vital signs in flow sheet. Tolerated Procedure well. 

## 2018-07-23 NOTE — Anesthesia Postprocedure Evaluation (Signed)
Anesthesia Post Note  Patient: Rebekah Murillo  Procedure(s) Performed: LEFT GREAT TOE REMOVAL HARDWARE (Left Foot) FUSION LEFT GREAT TOE METATARSALPHALANGEAL JOINT (MTPJ) (Left Foot)     Patient location during evaluation: PACU Anesthesia Type: General Level of consciousness: awake and alert Pain management: pain level controlled Vital Signs Assessment: post-procedure vital signs reviewed and stable Respiratory status: spontaneous breathing, nonlabored ventilation and respiratory function stable Cardiovascular status: blood pressure returned to baseline and stable Postop Assessment: no apparent nausea or vomiting Anesthetic complications: no    Last Vitals:  Vitals:   07/23/18 1115 07/23/18 1145  BP: (!) 143/90 (!) 148/82  Pulse: (!) 51 (!) 56  Resp: 10 16  Temp:  36.8 C  SpO2: 99% 100%    Last Pain:  Vitals:   07/23/18 1145  TempSrc:   PainSc: 0-No pain                 Lynda Rainwater

## 2018-07-23 NOTE — Anesthesia Preprocedure Evaluation (Addendum)
Anesthesia Evaluation  Patient identified by MRN, date of birth, ID band Patient awake    Reviewed: Allergy & Precautions, H&P , NPO status , Patient's Chart, lab work & pertinent test results, reviewed documented beta blocker date and time   History of Anesthesia Complications Negative for: history of anesthetic complications  Airway Mallampati: II  TM Distance: >3 FB Neck ROM: full    Dental  (+) Edentulous Upper, Edentulous Lower   Pulmonary neg pulmonary ROS, asthma , COPD, Current Smoker,    Pulmonary exam normal breath sounds clear to auscultation       Cardiovascular Exercise Tolerance: Good hypertension, negative cardio ROS   Rhythm:regular Rate:Normal     Neuro/Psych Anxiety Depression negative neurological ROS  negative psych ROS   GI/Hepatic negative GI ROS, Neg liver ROS,   Endo/Other  negative endocrine ROS  Renal/GU negative Renal ROS  negative genitourinary   Musculoskeletal negative musculoskeletal ROS (+)   Abdominal   Peds negative pediatric ROS (+)  Hematology negative hematology ROS (+)   Anesthesia Other Findings Major depression     PTSD (post-traumatic stress disorder)        Asthma     Eczema    Reproductive/Obstetrics negative OB ROS                            Anesthesia Physical  Anesthesia Plan  ASA: II  Anesthesia Plan: MAC and Regional   Post-op Pain Management:  Regional for Post-op pain   Induction: Intravenous  PONV Risk Score and Plan: 2 and Ondansetron, Midazolam and Treatment may vary due to age or medical condition  Airway Management Planned: LMA  Additional Equipment:   Intra-op Plan:   Post-operative Plan: Extubation in OR  Informed Consent: I have reviewed the patients History and Physical, chart, labs and discussed the procedure including the risks, benefits and alternatives for the proposed anesthesia with the patient or  authorized representative who has indicated his/her understanding and acceptance.     Dental Advisory Given  Plan Discussed with: CRNA and Surgeon  Anesthesia Plan Comments:        Anesthesia Quick Evaluation

## 2018-07-24 ENCOUNTER — Telehealth: Payer: Self-pay | Admitting: Radiology

## 2018-07-24 NOTE — Telephone Encounter (Signed)
Left great toe hardware removal 6/30 Nerve block has wore off, increased pain, percocet no relief. Patient also fell 6/30 post sx, feels like twisted foot.  Can take 2 percocet every 4 hrs and elevate, patient is coming in 7/2 for check per Erin.

## 2018-07-24 NOTE — Progress Notes (Signed)
During post op phone call pt stated her pain is 10/10 and that her current meds (percocet) is not working.  I educated pt to keep her foot elevated above her heart and to call Dr Jess Barters office due to pain not controlled by pain meds.  She also stated she fell at home yesterday while using her crutches but does not think she injured her operated foot, no injuries otherwise noted. When she calls Dr Jess Barters office about the pain she also verbalized understanding to let the office aware of the fall.

## 2018-07-25 ENCOUNTER — Other Ambulatory Visit: Payer: Self-pay | Admitting: Orthopedic Surgery

## 2018-07-25 ENCOUNTER — Ambulatory Visit: Payer: Medicare Other | Admitting: Physician Assistant

## 2018-07-25 MED ORDER — OXYCODONE-ACETAMINOPHEN 5-325 MG PO TABS: 1.0000 | ORAL_TABLET | ORAL | 0 refills | Status: DC | PRN

## 2018-07-25 NOTE — Telephone Encounter (Signed)
Patient did not show for her appointment today.  She states she has fallen at home but not hurt her foot.  A refill prescription for Percocet was called in.

## 2018-07-29 ENCOUNTER — Telehealth: Payer: Self-pay | Admitting: Orthopedic Surgery

## 2018-07-29 ENCOUNTER — Encounter (HOSPITAL_BASED_OUTPATIENT_CLINIC_OR_DEPARTMENT_OTHER): Payer: Self-pay | Admitting: Orthopedic Surgery

## 2018-07-29 NOTE — Telephone Encounter (Signed)
Patient has a rov tomorrow, but patient is out of pain medication. Patient states she was advised to take 2 pills at a time, and now out of medication. Patient request a refill on Percocet to [facility]. Please call patient if sent in.

## 2018-07-30 ENCOUNTER — Encounter: Payer: Self-pay | Admitting: Family

## 2018-07-30 ENCOUNTER — Ambulatory Visit (INDEPENDENT_AMBULATORY_CARE_PROVIDER_SITE_OTHER): Payer: Medicare Other | Admitting: Family

## 2018-07-30 ENCOUNTER — Other Ambulatory Visit: Payer: Self-pay

## 2018-07-30 VITALS — Ht 66.0 in | Wt 180.8 lb

## 2018-07-30 DIAGNOSIS — M2022 Hallux rigidus, left foot: Secondary | ICD-10-CM

## 2018-07-30 DIAGNOSIS — T85848D Pain due to other internal prosthetic devices, implants and grafts, subsequent encounter: Secondary | ICD-10-CM

## 2018-07-30 MED ORDER — OXYCODONE-ACETAMINOPHEN 5-325 MG PO TABS: 1.0000 | ORAL_TABLET | ORAL | 0 refills | Status: DC | PRN

## 2018-07-30 MED ORDER — OXYCODONE-ACETAMINOPHEN 5-325 MG PO TABS: 1.0000 | ORAL_TABLET | Freq: Four times a day (QID) | ORAL | 0 refills | Status: AC | PRN

## 2018-07-30 NOTE — Telephone Encounter (Signed)
Patient was in office today, Rx was refilled.

## 2018-07-30 NOTE — Progress Notes (Signed)
Post-Op Visit Note   Patient: Rebekah Murillo           Date of Birth: 1965/11/20           MRN: [id number] Visit Date: 07/30/2018 PCP: System, Provider Not In  Chief Complaint:  Chief Complaint  Patient presents with  . Left Foot - Routine Post Op    07/23/2018 left foot GT    HPI:  HPI Patient is a 53 year old woman who is seen today status post removal of painful hardware left great toe.  Complaining of some pain and swelling she did have a follow-up with her cardiologist.  She has been having increased pain doubling up on her Percocet at our office's recommendation and has run out.   Ortho Exam Incision is clean dry and intact.  There is minimal swelling to the left great toe.  Visit Diagnoses:  1. Hallux rigidus, left foot   2. Pain from implanted hardware, subsequent encounter     Plan: Continue to minimize weightbearing she will begin daily Dial soap cleansing and dry dressing changes.  Did refill her Percocet discussed that she will need to ease off of this.  With elevation for swelling.  She will follow-up in office in 2 weeks for suture removal.  Follow-Up Instructions: Return in about 2 weeks (around 08/13/2018).   Imaging: No results found.  Orders:  No orders of the defined types were placed in this encounter.  Meds ordered this encounter  Medications  . oxyCODONE-acetaminophen (PERCOCET/ROXICET) 5-325 MG tablet    Sig: Take 1 tablet by mouth every 4 (four) hours as needed for severe pain.    Dispense:  42 tablet    Refill:  0     PMFS History: Patient Active Problem List   Diagnosis Date Noted  . Hallux rigidus, left foot   . Pain from implanted hardware   . Acute bronchitis 05/04/2012  . Dyspnea 05/04/2012  . Pulmonary infiltrates 05/04/2012  . Chronic obstructive airway disease with asthma ([place]) 04/23/2012  . Tobacco abuse 04/23/2012  . PTSD (post-traumatic stress disorder) 04/23/2012  . Depression 04/23/2012  . Abnormal CXR (chest x-ray)  04/23/2012   Past Medical History:  Diagnosis Date  . Asthma   . Bronchitis   . Chronic, continuous use of opioids   . COPD (chronic obstructive pulmonary disease) ([place])   . Eczema   . Heavy smoker    2ppd  . Hypertension   . Major depression   . PTSD (post-traumatic stress disorder)     Family History  Problem Relation Age of Onset  . Cancer Mother     Past Surgical History:  Procedure Laterality Date  . APPENDECTOMY    . ARTHRODESIS METATARSALPHALANGEAL JOINT (MTPJ) Left 07/23/2018   Procedure: FUSION LEFT GREAT TOE METATARSALPHALANGEAL JOINT (MTPJ);  Surgeon: Newt Minion, MD;  Location: [facility];  Service: Orthopedics;  Laterality: Left;  block in preop  . BUNIONECTOMY     bilateral  . HARDWARE REMOVAL Left 07/23/2018   Procedure: LEFT GREAT TOE REMOVAL HARDWARE;  Surgeon: Newt Minion, MD;  Location: [facility];  Service: Orthopedics;  Laterality: Left;  block in preop  . ORIF ANKLE FRACTURE Right 04/23/2012   Procedure: Right ankle fracture/non-union takedown, grafting, plating;  Surgeon: Meredith Pel, MD;  Location: [facility];  Service: Orthopedics;  Laterality: Right;  Right ankle fracture non-union takedown, grafting, plating    Social History   Occupational History  . Not on file  Tobacco Use  . Smoking status: Current Every Day Smoker    Packs/day: 2.00    Years: 31.00    Pack years: 62.00    Types: Cigarettes  . Smokeless tobacco: Never Used  . Tobacco comment: Patient reports trying to cut back reports was smoking 2 packs a day  Substance and Sexual Activity  . Alcohol use: No  . Drug use: No  . Sexual activity: Not on file

## 2018-07-30 NOTE — Addendum Note (Signed)
Addended by: Dondra Prader R on: 07/30/2018 01:47 PM   Modules accepted: Orders

## 2018-08-05 ENCOUNTER — Other Ambulatory Visit: Payer: Self-pay | Admitting: Physician Assistant

## 2018-08-05 DIAGNOSIS — T85848D Pain due to other internal prosthetic devices, implants and grafts, subsequent encounter: Secondary | ICD-10-CM

## 2018-08-05 MED ORDER — OXYCODONE-ACETAMINOPHEN 5-325 MG PO TABS: 1.0000 | ORAL_TABLET | ORAL | 0 refills | Status: DC | PRN

## 2018-08-05 NOTE — Telephone Encounter (Signed)
Shawn please advise, thank you 

## 2018-08-05 NOTE — Telephone Encounter (Signed)
Refilled Percocet, but patient needs to taper use as much as possible.

## 2018-08-05 NOTE — Telephone Encounter (Signed)
Patient was called and informed that her Rx was ready.

## 2018-08-05 NOTE — Telephone Encounter (Signed)
Patient called in stating that she needs another refill. She had to take 2 for pain the one just didn't do it for her..  Please advise

## 2018-08-08 ENCOUNTER — Other Ambulatory Visit: Payer: Self-pay | Admitting: Physician Assistant

## 2018-08-08 ENCOUNTER — Telehealth: Payer: Self-pay | Admitting: Orthopedic Surgery

## 2018-08-08 DIAGNOSIS — T85848D Pain due to other internal prosthetic devices, implants and grafts, subsequent encounter: Secondary | ICD-10-CM

## 2018-08-08 MED ORDER — OXYCODONE-ACETAMINOPHEN 5-325 MG PO TABS: 1.0000 | ORAL_TABLET | ORAL | 0 refills | Status: AC | PRN

## 2018-08-08 NOTE — Telephone Encounter (Signed)
Patient called needing Rx refilled (Oxycodone) Patient said she is having to take 2 tabs at a time because the 5's are not working. Patient asked if the Rx can be written for 10"s? The number to contact patient is [phone]

## 2018-08-08 NOTE — Telephone Encounter (Signed)
Shawn, please advise. Thank you 

## 2018-08-08 NOTE — Telephone Encounter (Signed)
Refilled but will need to discuss further at next visit as using too much medication.

## 2018-08-09 NOTE — Telephone Encounter (Signed)
Patient was called and notified that her Rx was sent to her pharmacy and also informed to try and wean herself off these medications as much as possible. I advised her to elevate, to use ice packs if needed and to come to her next appt on Tues of next week. Patient understood and complied and stated she will be here and try to cut back to one pill. I also informed her that PA sent in to her pharmacy Oxy 5-325 mg this time and she understood.

## 2018-08-13 ENCOUNTER — Ambulatory Visit (INDEPENDENT_AMBULATORY_CARE_PROVIDER_SITE_OTHER): Payer: Medicare Other | Admitting: Family

## 2018-08-13 ENCOUNTER — Encounter: Payer: Self-pay | Admitting: Family

## 2018-08-13 VITALS — Ht 66.0 in | Wt 180.0 lb

## 2018-08-13 DIAGNOSIS — T85848D Pain due to other internal prosthetic devices, implants and grafts, subsequent encounter: Secondary | ICD-10-CM

## 2018-08-13 MED ORDER — HYDROCODONE-ACETAMINOPHEN 5-325 MG PO TABS: 1.0000 | ORAL_TABLET | Freq: Four times a day (QID) | ORAL | 0 refills | Status: DC | PRN

## 2018-08-13 NOTE — Progress Notes (Signed)
Office Visit Note   Patient: Rebekah HartshornRosemary Murillo           Date of Birth: [birth year]           MRN: [id number] Visit Date: 08/13/2018              Requested by: No referring provider defined for this encounter. PCP: System, Provider Not In  Chief Complaint  Patient presents with  . Left Foot - Routine Post Op    07/23/18 left GT hardware removal GT fusion       HPI: Patient is a 53 year old woman who is seen in follow-up status post removal of deep retained painful hardware.  Patient states that she has not been able to be very compliant because her boyfriend was just admitted to the hospital and she has been up on her foot for prolonged periods of time.  Patient states she is still smoking.  Patient requests referral to preferred pain clinic she states she was fired from [facility].  Assessment & Plan: Visit Diagnoses:  1. Pain from implanted hardware, subsequent encounter     Plan: Discussed with the patient the importance of weaning off the narcotics discussed that we could give her 1 final prescription of hydrocodone.  Discussed that she has had 160 Percocets in the last 3 weeks.  Discussed the importance of smoking cessation she may increase her activities as tolerated we will reevaluate in 4 weeks and will try to get her into preferred pain clinic.  Discussed that this may not be possible.  Follow-Up Instructions: Return in about 4 weeks (around 09/10/2018).   Ortho Exam  Patient is alert, oriented, no adenopathy, well-dressed, normal affect, normal respiratory effort. Examination the incision is well-healed and epithelialized there is no redness no swelling no cellulitis no signs of infection.  We will harvest the sutures today.  Imaging: No results found. No images are attached to the encounter.  Labs: Lab Results  Component Value Date   REPTSTATUS 11/17/2010 FINAL 11/15/2010   CULT  11/15/2010    Multiple bacterial morphotypes present, none  predominant. Suggest appropriate recollection if clinically indicated.     Lab Results  Component Value Date   ALBUMIN 3.8 10/06/2017   ALBUMIN 4.3 07/11/2012   ALBUMIN 3.6 03/03/2011    No results found for: MG No results found for: VD25OH  No results found for: PREALBUMIN CBC EXTENDED Latest Ref Rng & Units 07/23/2018 10/06/2017 10/16/2014  WBC 4.0 - 10.5 K/uL 10.3 12.0(H) -  RBC 3.87 - 5.11 MIL/uL 4.28 4.44 -  HGB 12.0 - 15.0 g/dL 40.913.1 81.113.6 16.7(H)  HCT 36.0 - 46.0 % 39.1 42.0 49.0(H)  PLT 150 - 400 K/uL 292 424(H) -  NEUTROABS 1.7 - 7.7 K/uL - - -  LYMPHSABS 0.7 - 4.0 K/uL - - -     Body mass index is 29.05 kg/m.  Orders:  No orders of the defined types were placed in this encounter.  No orders of the defined types were placed in this encounter.    Procedures: No procedures performed  Clinical Data: No additional findings.  ROS:  All other systems negative, except as noted in the HPI. Review of Systems  Objective: Vital Signs: Ht 5\' 6"  (1.676 m)   Wt 180 lb (81.6 kg)   BMI 29.05 kg/m   Specialty Comments:  No specialty comments available.  PMFS History: Patient Active Problem List   Diagnosis Date Noted  . Hallux rigidus, left foot   .  Pain from implanted hardware   . Acute bronchitis 05/04/2012  . Dyspnea 05/04/2012  . Pulmonary infiltrates 05/04/2012  . Chronic obstructive airway disease with asthma ([place]) 04/23/2012  . Tobacco abuse 04/23/2012  . PTSD (post-traumatic stress disorder) 04/23/2012  . Depression 04/23/2012  . Abnormal CXR (chest x-ray) 04/23/2012   Past Medical History:  Diagnosis Date  . Asthma   . Bronchitis   . Chronic, continuous use of opioids   . COPD (chronic obstructive pulmonary disease) ([place])   . Eczema   . Heavy smoker    2ppd  . Hypertension   . Major depression   . PTSD (post-traumatic stress disorder)     Family History  Problem Relation Age of Onset  . Cancer Mother     Past Surgical History:   Procedure Laterality Date  . APPENDECTOMY    . ARTHRODESIS METATARSALPHALANGEAL JOINT (MTPJ) Left 07/23/2018   Procedure: FUSION LEFT GREAT TOE METATARSALPHALANGEAL JOINT (MTPJ);  Surgeon: Newt Minion, MD;  Location: [facility];  Service: Orthopedics;  Laterality: Left;  block in preop  . BUNIONECTOMY     bilateral  . HARDWARE REMOVAL Left 07/23/2018   Procedure: LEFT GREAT TOE REMOVAL HARDWARE;  Surgeon: Newt Minion, MD;  Location: [facility];  Service: Orthopedics;  Laterality: Left;  block in preop  . ORIF ANKLE FRACTURE Right 04/23/2012   Procedure: Right ankle fracture/non-union takedown, grafting, plating;  Surgeon: Meredith Pel, MD;  Location: [facility];  Service: Orthopedics;  Laterality: Right;  Right ankle fracture non-union takedown, grafting, plating    Social History   Occupational History  . Not on file  Tobacco Use  . Smoking status: Current Every Day Smoker    Packs/day: 2.00    Years: 31.00    Pack years: 62.00    Types: Cigarettes  . Smokeless tobacco: Never Used  . Tobacco comment: Patient reports trying to cut back reports was smoking 2 packs a day  Substance and Sexual Activity  . Alcohol use: No  . Drug use: No  . Sexual activity: Not on file

## 2018-08-14 ENCOUNTER — Other Ambulatory Visit: Payer: Self-pay

## 2018-08-14 DIAGNOSIS — M79672 Pain in left foot: Secondary | ICD-10-CM

## 2018-08-14 DIAGNOSIS — T85848D Pain due to other internal prosthetic devices, implants and grafts, subsequent encounter: Secondary | ICD-10-CM

## 2018-08-14 DIAGNOSIS — M2022 Hallux rigidus, left foot: Secondary | ICD-10-CM

## 2018-08-20 ENCOUNTER — Telehealth: Payer: Self-pay

## 2018-08-20 ENCOUNTER — Telehealth: Payer: Self-pay | Admitting: Orthopedic Surgery

## 2018-08-20 ENCOUNTER — Other Ambulatory Visit: Payer: Self-pay | Admitting: Orthopedic Surgery

## 2018-08-20 MED ORDER — HYDROCODONE-ACETAMINOPHEN 5-325 MG PO TABS: 1.0000 | ORAL_TABLET | Freq: Four times a day (QID) | ORAL | 0 refills | Status: DC | PRN

## 2018-08-20 NOTE — Telephone Encounter (Signed)
Patient Rebekah Murillo on triage phone asking for refill on Norco [facility] [place] She said this would be the last time she asked Dr Sharol Given for medication.

## 2018-08-20 NOTE — Telephone Encounter (Signed)
rx sent

## 2018-08-20 NOTE — Telephone Encounter (Signed)
Patient called wanted to request a refill of Hydrocodone stating in so much pain.  Please call patient [phone]

## 2018-08-20 NOTE — Telephone Encounter (Signed)
Message sent to Dr. Sharol Given to advise.

## 2018-08-20 NOTE — Telephone Encounter (Signed)
Pt is s/p a GT hardware removal and fusion has had 120 Oxycodone 5/325 since 07/25/18 and 20 Hydrocodone  requesting refill on Hydrocodone

## 2018-08-26 ENCOUNTER — Telehealth: Payer: Self-pay | Admitting: Orthopedic Surgery

## 2018-08-26 NOTE — Telephone Encounter (Signed)
Patient called requesting an RX refill on her Hydrocodone.  Patient uses [facility] on [address].  CB#[phone].  Thank you.

## 2018-08-26 NOTE — Telephone Encounter (Signed)
Patient called for a refill of Hydrocodone   Please call patient [phone]

## 2018-08-26 NOTE — Telephone Encounter (Signed)
Pt calling asking for refill on Hydrocodone has had qty # 40 since 08/13/18 and 160 Oxycodone since surgery 07/22/18 left GT hdw removal and fusion please advise.

## 2018-08-26 NOTE — Telephone Encounter (Signed)
We should see her in the office if she is still having pain.  She has had 200 pain pills since surgery.

## 2018-08-27 ENCOUNTER — Telehealth: Payer: Self-pay

## 2018-08-27 NOTE — Telephone Encounter (Signed)
Patient was called and explained that per Dr Sharol Given requests her to come in for an earlier appt and was scheduled for next week on Wednesday to be seen for pain. Rx was refused at this time and patient understands.

## 2018-09-02 ENCOUNTER — Telehealth: Payer: Self-pay | Admitting: *Deleted

## 2018-09-02 NOTE — Telephone Encounter (Signed)
-----   Message from Pamella Pert, [place] sent at 08/30/2018 10:35 AM EDT ----- Regarding: FW: declined referral [place] Pt's referral has been denied for pain management by Dr. Read Drivers can you please refer pt to another pain management office of do I need to enter another referral?  ----- Message ----- From: Newt Minion, MD Sent: 08/29/2018   3:41 PM EDT To: Pamella Pert, RMA Subject: FW: declined referral [place]                   Referal to Dr. Read Drivers denied ----- Message ----- From: Roetta Sessions Sent: 08/28/2018   2:35 PM EDT To: Newt Minion, MD Subject: declined referral [place]                       Per clinical review  Ex patient  Nothing further we could offer

## 2018-09-02 NOTE — Telephone Encounter (Signed)
Referral has been faxed to [facility] pain clinic attn [place]. She will review and contact pt to schedule

## 2018-09-04 ENCOUNTER — Ambulatory Visit: Payer: Medicare Other | Admitting: Family

## 2018-09-04 ENCOUNTER — Telehealth: Payer: Self-pay | Admitting: Orthopedic Surgery

## 2018-09-04 NOTE — Telephone Encounter (Signed)
Patient called to request an RX for pain medication to last her until she sees her pain management doctor on the 26th of August.  CB#[phone].  Thank you.

## 2018-09-05 ENCOUNTER — Other Ambulatory Visit: Payer: Self-pay | Admitting: Orthopedic Surgery

## 2018-09-05 MED ORDER — HYDROCODONE-ACETAMINOPHEN 5-325 MG PO TABS: 1.0000 | ORAL_TABLET | Freq: Four times a day (QID) | ORAL | 0 refills | Status: DC | PRN

## 2018-09-05 NOTE — Telephone Encounter (Signed)
Dr Sharol Given please advise. Last Rx 08/20/2018 Qty # 20 for 5 days Hydro/Acet. Overall for the month of July a Qty of 170 pills dispensed. Thank you.

## 2018-09-05 NOTE — Telephone Encounter (Signed)
Patient called again inquiring about her medication refill.  I advised the patient that they are waiting on Dr. Sharol Given to respond to the message.  She stated that this is the last time she will request the pain medication.  She also said that her foot is swollen and hurting, but took the ace bandage off to see that would help.  Thank you.

## 2018-09-05 NOTE — Telephone Encounter (Signed)
Patient was called. Discussed she will need to elevate her foot and to continue to use her ace wraps for her legs and ankle for swelling.

## 2018-09-05 NOTE — Telephone Encounter (Signed)
rx sent

## 2018-09-10 ENCOUNTER — Ambulatory Visit: Payer: Medicare Other | Admitting: Family

## 2018-09-12 ENCOUNTER — Telehealth: Payer: Self-pay | Admitting: Orthopedic Surgery

## 2018-09-12 ENCOUNTER — Other Ambulatory Visit: Payer: Self-pay | Admitting: Orthopedic Surgery

## 2018-09-12 ENCOUNTER — Telehealth: Payer: Self-pay | Admitting: Family

## 2018-09-12 MED ORDER — HYDROCODONE-ACETAMINOPHEN 5-325 MG PO TABS: 1.0000 | ORAL_TABLET | Freq: Four times a day (QID) | ORAL | 0 refills | Status: DC | PRN

## 2018-09-12 NOTE — Telephone Encounter (Signed)
Patient called and stated can't make it to appt because she is in so much pain. Ca;;ed and spoke to [facility] and per Sharol Given he would write Rx. Advised patient she said thank you and she will be here tomorrow.  Advised patient to give a little time for Rx to get to pharmacy. She stated ok.

## 2018-09-12 NOTE — Telephone Encounter (Signed)
Please see below and advise.

## 2018-09-12 NOTE — Telephone Encounter (Signed)
Patient called needing Rx refilled (Hydrocodone) Patient said this is the last request for the pain medicine. Patient said she is going to pain management next week. The number to contact patient is [phone]

## 2018-09-12 NOTE — Telephone Encounter (Signed)
Medication was refilled for Vicodin and sent to [facility] off [address], pt was called and stated she will try to make it io her appt tomorrow w/Erin.

## 2018-09-12 NOTE — Telephone Encounter (Signed)
rx sent

## 2018-09-12 NOTE — Telephone Encounter (Signed)
Patient called back stating that if she does not get anything for the pain and swelling, she will not able to make the appointment tomorrow.  Thank you.

## 2018-09-12 NOTE — Telephone Encounter (Signed)
I called pt and there was no answer and no voicemail. The pt has an appt tomorrow and will advise that she can dicuss refill with Junie Panning. 09/05/18 was the last refill #20 will hold and try again.

## 2018-09-13 ENCOUNTER — Ambulatory Visit: Payer: Medicare Other | Admitting: Family

## 2018-09-13 ENCOUNTER — Telehealth: Payer: Self-pay

## 2018-09-13 NOTE — Telephone Encounter (Signed)
Pt was given rx for pain medication yesterday as she said that she would not be able to make it to her appointment today without it due to the pain. Pt cancelled her appointment for today and has not rescheduled.

## 2018-09-17 ENCOUNTER — Telehealth: Payer: Self-pay | Admitting: Orthopedic Surgery

## 2018-09-17 NOTE — Telephone Encounter (Signed)
Can you call and make an appt for pt to come in sooner if needed but unable to fill rx until evaluation.

## 2018-09-17 NOTE — Telephone Encounter (Signed)
Patient called to r/s her appointment and also to request an RX refill on her Hydrocodone.  Patient stated that she will not cancel this appointment.  CB#[phone].  Thank you.

## 2018-09-18 ENCOUNTER — Telehealth: Payer: Self-pay | Admitting: Orthopedic Surgery

## 2018-09-18 ENCOUNTER — Telehealth: Payer: Self-pay

## 2018-09-18 NOTE — Telephone Encounter (Signed)
I called pt and advised that we cannot refill medication without seeing her in the office first.

## 2018-09-18 NOTE — Telephone Encounter (Signed)
I sw patient in regards to the RX.  She stated that her foot is throbbing and may not be able to come in unless she had something for the pain and swelling.  She has removed the ace bandage as well.  Thank you.

## 2018-09-18 NOTE — Telephone Encounter (Signed)
Pt called in requesting a refill on hydrocodone and please have that sent to [facility] on [address]   [phone]

## 2018-09-18 NOTE — Telephone Encounter (Signed)
Patient called needing Rx refilled Hydrocodone.  Patient said she took the ace bandage off last week. Patient said she canceled her pain management appointment today do to the pain she is in.  The number to contact patient is [phone]

## 2018-09-18 NOTE — Telephone Encounter (Signed)
Pt is aware that she is not able to obtain a refill without being seen in the office first.

## 2018-09-18 NOTE — Telephone Encounter (Signed)
Pt called and states that she needs to have pain medication and that she wants me to convince Dr. Sharol Given to refill her pain medication and that she needs it. She states that it is throbbing and that she can not take it. Offered appt today and she declined. Offered appt tomorrow and she will come in tomorrow at 2:45

## 2018-09-18 NOTE — Telephone Encounter (Signed)
I called and had a very long discussion with the pt today. I advised that we had spoken multiple times and that I understand that she is having discomfort and advised all the things that she can do conservatively to get until appt tomorrow but that we can not give rx for pain medication today. We need to evaluate what is going on with the pt and then provide the correct treatment. Pt voiced understanding and she will keep appt tomorrow.

## 2018-09-19 ENCOUNTER — Ambulatory Visit (INDEPENDENT_AMBULATORY_CARE_PROVIDER_SITE_OTHER): Payer: Medicare Other

## 2018-09-19 ENCOUNTER — Encounter: Payer: Self-pay | Admitting: Orthopedic Surgery

## 2018-09-19 ENCOUNTER — Ambulatory Visit (INDEPENDENT_AMBULATORY_CARE_PROVIDER_SITE_OTHER): Payer: Medicare Other | Admitting: Orthopedic Surgery

## 2018-09-19 DIAGNOSIS — T85848D Pain due to other internal prosthetic devices, implants and grafts, subsequent encounter: Secondary | ICD-10-CM

## 2018-09-19 MED ORDER — HYDROCODONE-ACETAMINOPHEN 5-325 MG PO TABS: 1.0000 | ORAL_TABLET | Freq: Four times a day (QID) | ORAL | 0 refills | Status: DC | PRN

## 2018-09-19 NOTE — Progress Notes (Signed)
Office Visit Note   Patient: Rebekah Murillo           Date of Birth: 07-17-1965           MRN: [id number] Visit Date: 09/19/2018              Requested by: No referring provider defined for this encounter. PCP: System, Provider Not In  Chief Complaint  Patient presents with  . Left Foot - Routine Post Op    07/23/18 left great toe hdw removal and fusion       HPI: Patient is a 53 year old woman who is status post fusion left great toe MTP joint.  Patient states she still has pain with weightbearing.  She states that she did have an appointment with a pain clinic she canceled this because she was still in bed.  She states she has a new appointment scheduled.  Patient states that she is still smoking and she recalls that she was told this would not heal if she continued to smoke.  Assessment & Plan: Visit Diagnoses:  1. Pain from implanted hardware, subsequent encounter     Plan: Again discussed the importance of smoking cessation continue the postoperative shoe weightbearing as tolerated discussed the importance of Achilles stretching to unload pressure over the forefoot.  She is given one last prescription for pain medicine until her pain management appointment.  Follow-Up Instructions: Return if symptoms worsen or fail to improve.   Ortho Exam  Patient is alert, oriented, no adenopathy, well-dressed, normal affect, normal respiratory effort. Examination patient has an equinus contracture of the Achilles with dorsiflexion about 20 degrees short of neutral with her knee extended we reviewed Achilles stretching.  The toe fusion site has no redness no swelling no cellulitis no signs of infection.  Imaging: Xr Foot 2 Views Left  Result Date: 09/19/2018 2 view radiographs of the left great toe shows stable internal fixation revision fusion of the MTP joint.  There appears to still be a lucency from delayed union there is no hardware failure.  No images are attached to the  encounter.  Labs: Lab Results  Component Value Date   REPTSTATUS 11/17/2010 FINAL 11/15/2010   CULT  11/15/2010    Multiple bacterial morphotypes present, none predominant. Suggest appropriate recollection if clinically indicated.     Lab Results  Component Value Date   ALBUMIN 3.8 10/06/2017   ALBUMIN 4.3 07/11/2012   ALBUMIN 3.6 03/03/2011    No results found for: MG No results found for: VD25OH  No results found for: PREALBUMIN CBC EXTENDED Latest Ref Rng & Units 07/23/2018 10/06/2017 10/16/2014  WBC 4.0 - 10.5 K/uL 10.3 12.0(H) -  RBC 3.87 - 5.11 MIL/uL 4.28 4.44 -  HGB 12.0 - 15.0 g/dL 24.413.1 01.013.6 16.7(H)  HCT 36.0 - 46.0 % 39.1 42.0 49.0(H)  PLT 150 - 400 K/uL 292 424(H) -  NEUTROABS 1.7 - 7.7 K/uL - - -  LYMPHSABS 0.7 - 4.0 K/uL - - -     There is no height or weight on file to calculate BMI.  Orders:  Orders Placed This Encounter  Procedures  . XR Foot 2 Views Left   Meds ordered this encounter  Medications  . HYDROcodone-acetaminophen (NORCO/VICODIN) 5-325 MG tablet    Sig: Take 1 tablet by mouth every 6 (six) hours as needed for moderate pain.    Dispense:  20 tablet    Refill:  0     Procedures: No procedures performed  Clinical Data: No  additional findings.  ROS:  All other systems negative, except as noted in the HPI. Review of Systems  Objective: Vital Signs: There were no vitals taken for this visit.  Specialty Comments:  No specialty comments available.  PMFS History: Patient Active Problem List   Diagnosis Date Noted  . Hallux rigidus, left foot   . Pain from implanted hardware   . Acute bronchitis 05/04/2012  . Dyspnea 05/04/2012  . Pulmonary infiltrates 05/04/2012  . Chronic obstructive airway disease with asthma ([place]) 04/23/2012  . Tobacco abuse 04/23/2012  . PTSD (post-traumatic stress disorder) 04/23/2012  . Depression 04/23/2012  . Abnormal CXR (chest x-ray) 04/23/2012   Past Medical History:  Diagnosis Date  . Asthma    . Bronchitis   . Chronic, continuous use of opioids   . COPD (chronic obstructive pulmonary disease) ([place])   . Eczema   . Heavy smoker    2ppd  . Hypertension   . Major depression   . PTSD (post-traumatic stress disorder)     Family History  Problem Relation Age of Onset  . Cancer Mother     Past Surgical History:  Procedure Laterality Date  . APPENDECTOMY    . ARTHRODESIS METATARSALPHALANGEAL JOINT (MTPJ) Left 07/23/2018   Procedure: FUSION LEFT GREAT TOE METATARSALPHALANGEAL JOINT (MTPJ);  Surgeon: Newt Minion, MD;  Location: [place];  Service: Orthopedics;  Laterality: Left;  block in preop  . BUNIONECTOMY     bilateral  . HARDWARE REMOVAL Left 07/23/2018   Procedure: LEFT GREAT TOE REMOVAL HARDWARE;  Surgeon: Newt Minion, MD;  Location: [place];  Service: Orthopedics;  Laterality: Left;  block in preop  . ORIF ANKLE FRACTURE Right 04/23/2012   Procedure: Right ankle fracture/non-union takedown, grafting, plating;  Surgeon: Meredith Pel, MD;  Location: [place];  Service: Orthopedics;  Laterality: Right;  Right ankle fracture non-union takedown, grafting, plating    Social History   Occupational History  . Not on file  Tobacco Use  . Smoking status: Current Every Day Smoker    Packs/day: 2.00    Years: 31.00    Pack years: 62.00    Types: Cigarettes  . Smokeless tobacco: Never Used  . Tobacco comment: Patient reports trying to cut back reports was smoking 2 packs a day  Substance and Sexual Activity  . Alcohol use: No  . Drug use: No  . Sexual activity: Not on file

## 2018-09-24 ENCOUNTER — Telehealth: Payer: Self-pay | Admitting: Orthopedic Surgery

## 2018-09-24 NOTE — Telephone Encounter (Signed)
Patient called and requested a refill on Hydrocodone. Patient called and stated she starts pain mgmt on 10/19/18. She is asking for something to last her until then. Foot is still swollen and in pain.  Please call patient [phone]

## 2018-09-24 NOTE — Telephone Encounter (Signed)
Pt called in requesting a refill on hydrocodone said it will be the last time she ask's for this medication. Please have that sent to [facility] on [address].  [phone]

## 2018-09-25 NOTE — Telephone Encounter (Signed)
I called pt to advise that we are not able to refill rx. Pt voiced understanding and will call with any questions.

## 2018-09-25 NOTE — Telephone Encounter (Signed)
Message has already been sent to provider will sign off on this message.

## 2018-09-25 NOTE — Telephone Encounter (Signed)
09/19/18 #230 Hydrocodone rx given at appt advised that this would be the last one until her pain management appointment. Pt calling for another refill please advise.

## 2018-09-25 NOTE — Telephone Encounter (Signed)
No refill, follow with pain management

## 2018-09-26 ENCOUNTER — Ambulatory Visit: Payer: Medicare Other | Admitting: Orthopedic Surgery

## 2018-10-11 ENCOUNTER — Telehealth: Payer: Self-pay | Admitting: Orthopedic Surgery

## 2018-10-11 NOTE — Telephone Encounter (Signed)
Patient called. Would like pain meds called in for her. Her call back number is [phone]

## 2018-10-11 NOTE — Telephone Encounter (Signed)
I called pt and advised that pain management has cancelled her appt until 10/23/18 and she wants to know if she can have a refill on med until appt. Advised pt that I will hold message and sw Dr. Sharol Given on Monday. Pt voiced understanding.

## 2018-10-14 ENCOUNTER — Other Ambulatory Visit: Payer: Self-pay | Admitting: Orthopedic Surgery

## 2018-10-14 MED ORDER — HYDROCODONE-ACETAMINOPHEN 5-325 MG PO TABS: 1.0000 | ORAL_TABLET | Freq: Four times a day (QID) | ORAL | 0 refills | Status: AC | PRN

## 2018-10-14 NOTE — Telephone Encounter (Signed)
I called pt to advise that rx has been sent to the pharm.

## 2018-10-14 NOTE — Telephone Encounter (Signed)
07/23/18 GT fusion. Pt asking for refill on pain medication to last her until her pain management appt the end of the month. Please advise.

## 2018-10-14 NOTE — Telephone Encounter (Signed)
rx sent

## 2018-12-03 ENCOUNTER — Other Ambulatory Visit: Payer: Self-pay | Admitting: Pain Medicine

## 2018-12-03 DIAGNOSIS — M545 Low back pain, unspecified: Secondary | ICD-10-CM

## 2019-01-27 ENCOUNTER — Other Ambulatory Visit: Payer: Self-pay | Admitting: Internal Medicine

## 2019-01-27 DIAGNOSIS — Z1231 Encounter for screening mammogram for malignant neoplasm of breast: Secondary | ICD-10-CM

## 2019-01-30 ENCOUNTER — Other Ambulatory Visit: Payer: Self-pay

## 2019-01-30 ENCOUNTER — Ambulatory Visit
Admission: RE | Admit: 2019-01-30 | Discharge: 2019-01-30 | Disposition: A | Discharge status: Home / Self Care | Payer: Medicare Other | Source: Ambulatory Visit | Attending: Pain Medicine | Admitting: Pain Medicine

## 2019-01-30 ENCOUNTER — Ambulatory Visit: Payer: Medicare Other

## 2019-01-30 DIAGNOSIS — M545 Low back pain, unspecified: Secondary | ICD-10-CM

## 2019-03-07 ENCOUNTER — Ambulatory Visit: Payer: Medicare Other

## 2019-06-19 ENCOUNTER — Other Ambulatory Visit: Payer: Self-pay | Admitting: Nurse Practitioner

## 2019-06-19 DIAGNOSIS — R10819 Abdominal tenderness, unspecified site: Secondary | ICD-10-CM

## 2019-06-20 ENCOUNTER — Other Ambulatory Visit: Payer: Self-pay | Admitting: Nurse Practitioner

## 2019-06-20 DIAGNOSIS — Z87891 Personal history of nicotine dependence: Secondary | ICD-10-CM

## 2019-06-27 ENCOUNTER — Ambulatory Visit: Payer: Medicare Other

## 2019-07-02 ENCOUNTER — Other Ambulatory Visit: Payer: Medicare Other

## 2019-07-04 ENCOUNTER — Ambulatory Visit: Payer: Medicare Other

## 2019-07-07 ENCOUNTER — Ambulatory Visit: Payer: Medicare Other

## 2020-06-15 ENCOUNTER — Emergency Department (HOSPITAL_COMMUNITY): Payer: Medicare Other

## 2020-06-15 ENCOUNTER — Emergency Department (HOSPITAL_COMMUNITY)
Admission: EM | Admit: 2020-06-15 | Discharge: 2020-06-15 | Disposition: A | Discharge status: Home / Self Care | Payer: Medicare Other | Attending: Emergency Medicine | Admitting: Emergency Medicine

## 2020-06-15 ENCOUNTER — Other Ambulatory Visit: Payer: Self-pay

## 2020-06-15 ENCOUNTER — Encounter (HOSPITAL_COMMUNITY): Payer: Self-pay | Admitting: Emergency Medicine

## 2020-06-15 DIAGNOSIS — R2242 Localized swelling, mass and lump, left lower limb: Secondary | ICD-10-CM | POA: Diagnosis not present

## 2020-06-15 DIAGNOSIS — M79672 Pain in left foot: Secondary | ICD-10-CM | POA: Insufficient documentation

## 2020-06-15 DIAGNOSIS — Z5321 Procedure and treatment not carried out due to patient leaving prior to being seen by health care provider: Secondary | ICD-10-CM | POA: Insufficient documentation

## 2020-06-15 NOTE — ED Triage Notes (Signed)
Patient reports pain at left foot with swelling onset last mont , denies recent injury/ambulatory .

## 2020-06-15 NOTE — ED Provider Notes (Signed)
Emergency Medicine Provider Triage Evaluation Note  Rebekah Murillo , a 55 y.o. female  was evaluated in triage.  Pt complains of left foot pain and swelling over the last several weeks.  Review of Systems  Positive: Left foot pain and swelling Negative: Numbness, weakness, injury  Physical Exam  BP (!) 161/68   Pulse 67   Temp 97.9 F (36.6 C) (Oral)   Resp 16   Ht 5\' 6"  (1.676 m)   Wt 110 kg   SpO2 98%   BMI 39.14 kg/m  Gen:   Awake, no distress   Resp:  Normal effort  MSK:   Moves extremities without difficulty  Other:    Medical Decision Making  Medically screening exam initiated at 9:59 PM.  Appropriate orders placed.  Adalaide Jaskolski was informed that the remainder of the evaluation will be completed by another provider, this initial triage assessment does not replace that evaluation, and the importance of remaining in the ED until their evaluation is complete.     Catarina Hartshorn, PA-C 06/15/20 2229    2230, MD 06/16/20 06/18/20

## 2020-06-15 NOTE — ED Notes (Addendum)
Pt decided to not be seen due to wanting to be with her boyfriend who is a pt

## 2020-10-28 ENCOUNTER — Ambulatory Visit: Payer: Medicare Other | Admitting: Physical Therapy

## 2021-03-16 ENCOUNTER — Other Ambulatory Visit: Payer: Self-pay | Admitting: Internal Medicine

## 2021-03-17 LAB — COMPLETE METABOLIC PANEL WITH GFR
AG Ratio: 1.3 (calc) (ref 1.0–2.5)
ALT: 25 U/L (ref 6–29)
AST: 21 U/L (ref 10–35)
Albumin: 3.9 g/dL (ref 3.6–5.1)
Alkaline phosphatase (APISO): 115 U/L (ref 37–153)
BUN: 16 mg/dL (ref 7–25)
CO2: 20 mmol/L (ref 20–32)
Calcium: 9.3 mg/dL (ref 8.6–10.4)
Chloride: 104 mmol/L (ref 98–110)
Creat: 0.64 mg/dL (ref 0.50–1.03)
Globulin: 3.1 g/dL (calc) (ref 1.9–3.7)
Glucose, Bld: 84 mg/dL (ref 65–99)
Potassium: 4.9 mmol/L (ref 3.5–5.3)
Sodium: 136 mmol/L (ref 135–146)
Total Bilirubin: 0.4 mg/dL (ref 0.2–1.2)
Total Protein: 7 g/dL (ref 6.1–8.1)
eGFR: 104 mL/min/{1.73_m2} (ref >=60)

## 2021-03-17 LAB — LIPID PANEL
Cholesterol: 283 mg/dL — ABNORMAL HIGH (ref <200)
HDL: 49 mg/dL — ABNORMAL LOW (ref >=50)
LDL Cholesterol (Calc): 193 mg/dL (calc) — ABNORMAL HIGH
Non-HDL Cholesterol (Calc): 234 mg/dL (calc) — ABNORMAL HIGH (ref <130)
Total CHOL/HDL Ratio: 5.8 (calc) — ABNORMAL HIGH (ref <5.0)
Triglycerides: 217 mg/dL — ABNORMAL HIGH (ref <150)

## 2021-03-17 LAB — CBC
HCT: 40.8 % (ref 35.0–45.0)
Hemoglobin: 13.7 g/dL (ref 11.7–15.5)
MCH: 30.6 pg (ref 27.0–33.0)
MCHC: 33.6 g/dL (ref 32.0–36.0)
MCV: 91.3 fL (ref 80.0–100.0)
MPV: 10.5 fL (ref 7.5–12.5)
Platelets: 310 10*3/uL (ref 140–400)
RBC: 4.47 10*6/uL (ref 3.80–5.10)
RDW: 13.6 % (ref 11.0–15.0)
WBC: 7 10*3/uL (ref 3.8–10.8)

## 2021-03-17 LAB — VITAMIN D 25 HYDROXY (VIT D DEFICIENCY, FRACTURES): Vit D, 25-Hydroxy: 20 ng/mL — ABNORMAL LOW (ref 30–100)

## 2021-03-17 LAB — TSH: TSH: 1.62 mIU/L (ref 0.40–4.50)

## 2021-03-21 ENCOUNTER — Other Ambulatory Visit: Payer: Self-pay | Admitting: Internal Medicine

## 2021-03-21 DIAGNOSIS — E2839 Other primary ovarian failure: Secondary | ICD-10-CM

## 2021-04-14 ENCOUNTER — Other Ambulatory Visit: Payer: Self-pay | Admitting: Internal Medicine

## 2021-04-14 DIAGNOSIS — Z1231 Encounter for screening mammogram for malignant neoplasm of breast: Secondary | ICD-10-CM

## 2021-04-19 ENCOUNTER — Other Ambulatory Visit: Payer: Medicare Other

## 2021-04-25 ENCOUNTER — Ambulatory Visit: Payer: Medicare Other

## 2021-05-03 ENCOUNTER — Ambulatory Visit: Payer: Medicare Other

## 2021-05-12 ENCOUNTER — Ambulatory Visit: Payer: Medicare Other

## 2021-05-30 ENCOUNTER — Ambulatory Visit: Payer: Medicare Other

## 2021-06-27 ENCOUNTER — Ambulatory Visit: Payer: Medicare Other

## 2021-09-29 ENCOUNTER — Other Ambulatory Visit: Payer: Medicare Other

## 2021-10-24 ENCOUNTER — Ambulatory Visit: Payer: Medicare Other

## 2021-11-07 ENCOUNTER — Ambulatory Visit: Payer: Medicare Other

## 2022-01-04 ENCOUNTER — Ambulatory Visit: Payer: Medicare Other

## 2022-03-10 ENCOUNTER — Other Ambulatory Visit: Payer: 59

## 2022-03-10 ENCOUNTER — Ambulatory Visit: Payer: Medicare Other

## 2022-03-10 ENCOUNTER — Other Ambulatory Visit: Payer: Self-pay | Admitting: Internal Medicine

## 2022-03-10 DIAGNOSIS — Z1231 Encounter for screening mammogram for malignant neoplasm of breast: Secondary | ICD-10-CM

## 2022-03-10 DIAGNOSIS — E2839 Other primary ovarian failure: Secondary | ICD-10-CM

## 2022-08-09 ENCOUNTER — Ambulatory Visit (HOSPITAL_COMMUNITY): Payer: 59 | Admitting: Student

## 2022-08-10 ENCOUNTER — Other Ambulatory Visit: Payer: 59

## 2022-08-10 ENCOUNTER — Ambulatory Visit: Payer: 59

## 2022-08-18 ENCOUNTER — Ambulatory Visit: Payer: 59

## 2022-08-29 ENCOUNTER — Ambulatory Visit: Payer: 59

## 2022-09-08 ENCOUNTER — Ambulatory Visit: Payer: 59

## 2022-09-15 ENCOUNTER — Ambulatory Visit: Payer: 59

## 2022-10-11 ENCOUNTER — Ambulatory Visit: Payer: 59
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
## 2022-10-18 ENCOUNTER — Encounter (HOSPITAL_COMMUNITY): Payer: Self-pay | Admitting: Student

## 2022-10-18 ENCOUNTER — Ambulatory Visit (HOSPITAL_COMMUNITY): Payer: 59 | Admitting: Student

## 2022-10-18 VITALS — BP 111/70 | Wt 174.0 lb

## 2022-10-18 DIAGNOSIS — Z789 Other specified health status: Secondary | ICD-10-CM

## 2022-10-18 DIAGNOSIS — F1510 Other stimulant abuse, uncomplicated: Secondary | ICD-10-CM

## 2022-10-18 DIAGNOSIS — F4310 Post-traumatic stress disorder, unspecified: Secondary | ICD-10-CM

## 2022-10-18 DIAGNOSIS — Z79891 Long term (current) use of opiate analgesic: Secondary | ICD-10-CM | POA: Insufficient documentation

## 2022-10-18 DIAGNOSIS — Z79899 Other long term (current) drug therapy: Secondary | ICD-10-CM | POA: Diagnosis not present

## 2022-10-18 DIAGNOSIS — G4726 Circadian rhythm sleep disorder, shift work type: Secondary | ICD-10-CM

## 2022-10-18 DIAGNOSIS — F17210 Nicotine dependence, cigarettes, uncomplicated: Secondary | ICD-10-CM

## 2022-10-18 DIAGNOSIS — Z720 Tobacco use: Secondary | ICD-10-CM

## 2022-10-19 DIAGNOSIS — Z789 Other specified health status: Secondary | ICD-10-CM | POA: Insufficient documentation

## 2022-12-01 ENCOUNTER — Ambulatory Visit (HOSPITAL_COMMUNITY): Payer: 59 | Admitting: Clinical

## 2023-01-26 ENCOUNTER — Ambulatory Visit (HOSPITAL_COMMUNITY): Payer: 59 | Admitting: Clinical

## 2023-01-30 ENCOUNTER — Inpatient Hospital Stay: Admission: RE | Admit: 2023-01-30 | Payer: 59 | Source: Ambulatory Visit

## 2023-03-07 ENCOUNTER — Other Ambulatory Visit (HOSPITAL_BASED_OUTPATIENT_CLINIC_OR_DEPARTMENT_OTHER): Payer: Self-pay | Admitting: Adult Medicine

## 2023-03-07 DIAGNOSIS — Z1231 Encounter for screening mammogram for malignant neoplasm of breast: Secondary | ICD-10-CM

## 2023-04-05 ENCOUNTER — Other Ambulatory Visit: Payer: Self-pay | Admitting: Internal Medicine

## 2023-04-05 DIAGNOSIS — E2839 Other primary ovarian failure: Secondary | ICD-10-CM

## 2023-04-05 DIAGNOSIS — Z1231 Encounter for screening mammogram for malignant neoplasm of breast: Secondary | ICD-10-CM

## 2023-04-10 ENCOUNTER — Ambulatory Visit
Admission: RE | Admit: 2023-04-10 | Discharge: 2023-04-10 | Disposition: A | Discharge status: Home / Self Care | Source: Ambulatory Visit | Attending: Internal Medicine | Admitting: Internal Medicine

## 2023-04-10 DIAGNOSIS — Z1231 Encounter for screening mammogram for malignant neoplasm of breast: Secondary | ICD-10-CM

## 2023-04-13 ENCOUNTER — Other Ambulatory Visit: Payer: Self-pay | Admitting: Internal Medicine

## 2023-04-13 DIAGNOSIS — R928 Other abnormal and inconclusive findings on diagnostic imaging of breast: Secondary | ICD-10-CM

## 2023-04-27 ENCOUNTER — Ambulatory Visit
Admission: RE | Admit: 2023-04-27 | Discharge: 2023-04-27 | Disposition: A | Discharge status: Home / Self Care | Source: Ambulatory Visit | Attending: Internal Medicine | Admitting: Internal Medicine

## 2023-04-27 DIAGNOSIS — R928 Other abnormal and inconclusive findings on diagnostic imaging of breast: Secondary | ICD-10-CM

## 2023-04-30 NOTE — Progress Notes (Deleted)
 ANNUAL EXAM Patient name: Rebekah Murillo MRN [id number]  Date of birth: 04-01-1965 Chief Complaint:   No chief complaint on file.  History of Present Illness:   Rebekah Murillo is a 58 y.o. No obstetric history on file. {race:25618} female being seen today for a routine annual exam.  Current complaints: ***  No LMP recorded. Patient is postmenopausal.   The pregnancy intention screening data noted above was reviewed. Potential methods of contraception were discussed. The patient elected to proceed with No data recorded.   Last pap ***. Results were: {Pap findings:25134}. H/O abnormal pap: {yes/yes***/no:23866} Last mammogram: 04/10/23. Results were: normal, R breast asymmetry Family h/o breast cancer: {yes***/no:23838} Last colonoscopy: ***. Results were: {normal, abnormal, n/a:23837}. Family h/o colorectal cancer: {yes***/no:23838}     10/18/2022    8:36 AM 03/08/2015   12:13 PM 02/08/2015    3:18 PM 01/18/2015    5:34 PM 01/14/2015    1:26 PM  Depression screen PHQ 2/9  Decreased Interest  0 0 0 0  Down, Depressed, Hopeless  0 0 0 0  PHQ - 2 Score  0 0 0 0     Information is confidential and restricted. Go to Review Flowsheets to unlock data.         No data to display           Review of Systems:   Pertinent items are noted in HPI Denies any headaches, blurred vision, fatigue, shortness of breath, chest pain, abdominal pain, abnormal vaginal discharge/itching/odor/irritation, problems with periods, bowel movements, urination, or intercourse unless otherwise stated above. Pertinent History Reviewed:  Reviewed past medical,surgical, social and family history.  Reviewed problem list, medications and allergies. Physical Assessment:  There were no vitals filed for this visit.There is no height or weight on file to calculate BMI.        Physical Examination:   General appearance - well appearing, and in no distress  Mental status - alert, oriented to person, place, and  time  Psych:  She has a normal mood and affect  Skin - warm and dry, normal color, no suspicious lesions noted  Chest - effort normal, all lung fields clear to auscultation bilaterally  Heart - normal rate and regular rhythm  Neck:  midline trachea, no thyromegaly or nodules  Breasts - breasts appear normal, no suspicious masses, no skin or nipple changes or  axillary nodes  Abdomen - soft, nontender, nondistended, no masses or organomegaly  Pelvic - VULVA: normal appearing vulva with no masses, tenderness or lesions  VAGINA: normal appearing vagina with normal color and discharge, no lesions  CERVIX: normal appearing cervix without discharge or lesions, no CMT  Thin prep pap is {Desc; done/not:10129} *** HR HPV cotesting  UTERUS: uterus is felt to be normal size, shape, consistency and nontender   ADNEXA: No adnexal masses or tenderness noted.  Rectal - normal rectal, good sphincter tone, no masses felt. Hemoccult: ***  Extremities:  No swelling or varicosities noted  Chaperone present for exam  No results found for this or any previous visit (from the past 24 hours).  Assessment & Plan:  1. Encounter for annual routine gynecological examination (Primary) - Cervical cancer screening: Discussed guidelines. Pap with HPV *** - Breast Health: Encouraged self breast awareness/SBE. Discussed limits of clinical breast exam for detecting breast cancer. Discussed importance of annual MXR. {Blank single:19197::"Rx given for [facility] is up to date: ***"} - Climacteric/Sexual health: Reviewed typical and atypical symptoms of menopause/peri-menopause. Discussed PMB and to call  if any amount of spotting.  - Colonoscopy: {Blank single:19197::"Per PCP","up to date","declines"} - F/U 12 months and prn   No orders of the defined types were placed in this encounter.   Meds: No orders of the defined types were placed in this encounter.   Follow-up: No follow-ups on file.  Ralene Muskrat,  [place] 04/30/2023 8:15 AM

## 2023-05-02 ENCOUNTER — Encounter: Admitting: Physician Assistant

## 2023-05-02 DIAGNOSIS — Z01419 Encounter for gynecological examination (general) (routine) without abnormal findings: Secondary | ICD-10-CM

## 2023-06-29 ENCOUNTER — Telehealth: Payer: Self-pay | Admitting: Physician Assistant

## 2023-06-29 NOTE — Telephone Encounter (Signed)
 Called the pt left message with her significant other to have her call the office on Monday to get scheduled as NEWGYN w/PAP from a referral that we rec'd from [facility].

## 2023-07-09 NOTE — Telephone Encounter (Signed)
Pt was called and has been scheduled

## 2023-07-26 ENCOUNTER — Encounter: Admitting: Physician Assistant

## 2023-07-29 NOTE — Progress Notes (Unsigned)
 ANNUAL EXAM Patient name: Rebekah Murillo MRN [id number]  Date of birth: 1965/06/06 Chief Complaint:   No chief complaint on file.  History of Present Illness:   Rebekah Murillo is a 58 y.o. No obstetric history on file. {race:25618} female being seen today for a routine annual exam.  Current complaints: ***  No LMP recorded. Patient is postmenopausal.   The pregnancy intention screening data noted above was reviewed. Potential methods of contraception were discussed. The patient elected to proceed with No data recorded.   Last pap ***. Results were: {Pap findings:25134}. H/O abnormal pap: {yes/yes***/no:23866} Last mammogram: 04/10/2023. Results were: Normal. Family h/o breast cancer: {yes***/no:23838} Last colonoscopy: ***. Results were: {normal, abnormal, n/a:23837}. Family h/o colorectal cancer: {yes***/no:23838} STI screening:      10/18/2022    8:36 AM 03/08/2015   12:13 PM 02/08/2015    3:18 PM 01/18/2015    5:34 PM 01/14/2015    1:26 PM  Depression screen PHQ 2/9  Decreased Interest  0 0 0 0  Down, Depressed, Hopeless  0 0 0 0  PHQ - 2 Score  0 0 0 0     Information is confidential and restricted. Go to Review Flowsheets to unlock data.         No data to display           Review of Systems:   Pertinent items are noted in HPI Denies any headaches, blurred vision, fatigue, shortness of breath, chest pain, abdominal pain, abnormal vaginal discharge/itching/odor/irritation, problems with periods, bowel movements, urination, or intercourse unless otherwise stated above. Pertinent History Reviewed:  Reviewed past medical,surgical, social and family history.  Reviewed problem list, medications and allergies. Physical Assessment:  There were no vitals filed for this visit.There is no height or weight on file to calculate BMI.        Physical Examination:   General appearance - well appearing, and in no distress  Mental status - alert, oriented to person, place,  and time  Psych:  She has a normal mood and affect  Skin - warm and dry, normal color, no suspicious lesions noted  Chest - effort normal, all lung fields clear to auscultation bilaterally  Heart - normal rate and regular rhythm  Neck:  midline trachea, no thyromegaly or nodules  Breasts - breasts appear normal, no suspicious masses, no skin or nipple changes or  axillary nodes  Abdomen - soft, nontender, nondistended, no masses or organomegaly  Pelvic - VULVA: normal appearing vulva with no masses, tenderness or lesions  VAGINA: normal appearing vagina with normal color and discharge, no lesions  CERVIX: normal appearing cervix without discharge or lesions, no CMT  Thin prep pap is {Desc; done/not:10129} *** HR HPV cotesting  UTERUS: uterus is felt to be normal size, shape, consistency and nontender   ADNEXA: No adnexal masses or tenderness noted.  Rectal - normal rectal, good sphincter tone, no masses felt. Hemoccult: ***  Extremities:  No swelling or varicosities noted  Chaperone present for exam  No results found for this or any previous visit (from the past 24 hours).  Assessment & Plan:  - Cervical cancer screening: Discussed guidelines. Pap with HPV *** - Breast Health: Encouraged self breast awareness/SBE. Discussed limits of clinical breast exam for detecting breast cancer. Discussed importance of annual MXR. {Blank single:19197::Rx given for [name] is up to date: ***} - Climacteric/Sexual health: Reviewed typical and atypical symptoms of menopause/peri-menopause. Discussed PMB and to call if any amount of spotting.  - Colonoscopy: {  Blank single:19197::Per PCP,up to date,declines} - F/U 12 months and prn   No orders of the defined types were placed in this encounter.   Meds: No orders of the defined types were placed in this encounter.   Follow-up: No follow-ups on file.  Rebekah Murillo E Rebekah Murillo, [place] 07/29/2023 6:46 PM

## 2023-08-01 ENCOUNTER — Encounter: Payer: Self-pay | Admitting: Physician Assistant

## 2023-08-01 ENCOUNTER — Ambulatory Visit (INDEPENDENT_AMBULATORY_CARE_PROVIDER_SITE_OTHER): Admitting: Physician Assistant

## 2023-08-01 ENCOUNTER — Other Ambulatory Visit (HOSPITAL_COMMUNITY)
Admission: RE | Admit: 2023-08-01 | Discharge: 2023-08-01 | Disposition: A | Discharge status: Home / Self Care | Source: Ambulatory Visit | Attending: Physician Assistant | Admitting: Physician Assistant

## 2023-08-01 VITALS — BP 133/77 | HR 67 | Ht 66.5 in | Wt 188.8 lb

## 2023-08-01 DIAGNOSIS — Z1211 Encounter for screening for malignant neoplasm of colon: Secondary | ICD-10-CM

## 2023-08-01 DIAGNOSIS — Z01419 Encounter for gynecological examination (general) (routine) without abnormal findings: Secondary | ICD-10-CM | POA: Diagnosis not present

## 2023-08-01 DIAGNOSIS — Z1231 Encounter for screening mammogram for malignant neoplasm of breast: Secondary | ICD-10-CM

## 2023-08-01 DIAGNOSIS — Z124 Encounter for screening for malignant neoplasm of cervix: Secondary | ICD-10-CM | POA: Diagnosis not present

## 2023-08-01 DIAGNOSIS — Z113 Encounter for screening for infections with a predominantly sexual mode of transmission: Secondary | ICD-10-CM | POA: Insufficient documentation

## 2023-08-01 DIAGNOSIS — Z1151 Encounter for screening for human papillomavirus (HPV): Secondary | ICD-10-CM | POA: Diagnosis not present

## 2023-08-01 DIAGNOSIS — Z1331 Encounter for screening for depression: Secondary | ICD-10-CM | POA: Diagnosis not present

## 2023-08-01 NOTE — Progress Notes (Signed)
 Pt presents for AEX Last PAP unknown Last mammogram 15 years  Requesting STD testing Pt reports previous Dx of Hep C but unsure   Pt has Hx of PTSD

## 2023-08-02 ENCOUNTER — Ambulatory Visit: Payer: Self-pay | Admitting: Physician Assistant

## 2023-08-02 LAB — CERVICOVAGINAL ANCILLARY ONLY
Bacterial Vaginitis (gardnerella): NEGATIVE
Candida Glabrata: NEGATIVE
Candida Vaginitis: NEGATIVE
Chlamydia: NEGATIVE
Comment: NEGATIVE
Comment: NEGATIVE
Comment: NEGATIVE
Comment: NEGATIVE
Comment: NEGATIVE
Comment: NORMAL
Neisseria Gonorrhea: NEGATIVE
Trichomonas: NEGATIVE

## 2023-08-07 LAB — CYTOLOGY - PAP
Comment: NEGATIVE
Diagnosis: NEGATIVE
High risk HPV: NEGATIVE

## 2023-08-14 ENCOUNTER — Emergency Department (HOSPITAL_COMMUNITY)

## 2023-08-14 ENCOUNTER — Encounter (HOSPITAL_COMMUNITY): Payer: Self-pay | Admitting: Emergency Medicine

## 2023-08-14 ENCOUNTER — Other Ambulatory Visit: Payer: Self-pay

## 2023-08-14 ENCOUNTER — Emergency Department (HOSPITAL_COMMUNITY)
Admission: EM | Admit: 2023-08-14 | Discharge: 2023-08-14 | Discharge status: Against Medical Advice | Attending: Emergency Medicine | Admitting: Emergency Medicine

## 2023-08-14 DIAGNOSIS — J439 Emphysema, unspecified: Secondary | ICD-10-CM | POA: Diagnosis not present

## 2023-08-14 DIAGNOSIS — I700 Atherosclerosis of aorta: Secondary | ICD-10-CM | POA: Diagnosis not present

## 2023-08-14 DIAGNOSIS — M4802 Spinal stenosis, cervical region: Secondary | ICD-10-CM | POA: Insufficient documentation

## 2023-08-14 DIAGNOSIS — X58XXXS Exposure to other specified factors, sequela: Secondary | ICD-10-CM | POA: Insufficient documentation

## 2023-08-14 DIAGNOSIS — Z5329 Procedure and treatment not carried out because of patient's decision for other reasons: Secondary | ICD-10-CM | POA: Diagnosis not present

## 2023-08-14 DIAGNOSIS — Z23 Encounter for immunization: Secondary | ICD-10-CM | POA: Diagnosis not present

## 2023-08-14 DIAGNOSIS — S6991XA Unspecified injury of right wrist, hand and finger(s), initial encounter: Secondary | ICD-10-CM | POA: Diagnosis present

## 2023-08-14 DIAGNOSIS — R109 Unspecified abdominal pain: Secondary | ICD-10-CM | POA: Diagnosis not present

## 2023-08-14 DIAGNOSIS — Y92410 Unspecified street and highway as the place of occurrence of the external cause: Secondary | ICD-10-CM | POA: Insufficient documentation

## 2023-08-14 DIAGNOSIS — S62609B Fracture of unspecified phalanx of unspecified finger, initial encounter for open fracture: Secondary | ICD-10-CM

## 2023-08-14 DIAGNOSIS — Y907 Blood alcohol level of 200-239 mg/100 ml: Secondary | ICD-10-CM | POA: Diagnosis not present

## 2023-08-14 DIAGNOSIS — R4182 Altered mental status, unspecified: Secondary | ICD-10-CM | POA: Diagnosis present

## 2023-08-14 DIAGNOSIS — K5730 Diverticulosis of large intestine without perforation or abscess without bleeding: Secondary | ICD-10-CM | POA: Diagnosis not present

## 2023-08-14 DIAGNOSIS — S62636B Displaced fracture of distal phalanx of right little finger, initial encounter for open fracture: Secondary | ICD-10-CM | POA: Insufficient documentation

## 2023-08-14 DIAGNOSIS — I2510 Atherosclerotic heart disease of native coronary artery without angina pectoris: Secondary | ICD-10-CM | POA: Insufficient documentation

## 2023-08-14 DIAGNOSIS — S22048S Other fracture of fourth thoracic vertebra, sequela: Secondary | ICD-10-CM | POA: Diagnosis not present

## 2023-08-14 DIAGNOSIS — F10921 Alcohol use, unspecified with intoxication delirium: Secondary | ICD-10-CM | POA: Diagnosis not present

## 2023-08-14 DIAGNOSIS — S22038S Other fracture of third thoracic vertebra, sequela: Secondary | ICD-10-CM | POA: Diagnosis not present

## 2023-08-14 DIAGNOSIS — M47812 Spondylosis without myelopathy or radiculopathy, cervical region: Secondary | ICD-10-CM | POA: Diagnosis not present

## 2023-08-14 DIAGNOSIS — R079 Chest pain, unspecified: Secondary | ICD-10-CM | POA: Diagnosis present

## 2023-08-14 LAB — URINALYSIS, ROUTINE W REFLEX MICROSCOPIC
Bilirubin Urine: NEGATIVE
Glucose, UA: NEGATIVE mg/dL
Ketones, ur: NEGATIVE mg/dL
Leukocytes,Ua: NEGATIVE
Nitrite: NEGATIVE
Protein, ur: NEGATIVE mg/dL
Specific Gravity, Urine: 1.012 (ref 1.005–1.030)
pH: 6 (ref 5.0–8.0)

## 2023-08-14 LAB — CBC WITH DIFFERENTIAL/PLATELET
Abs Immature Granulocytes: 0.17 K/uL — ABNORMAL HIGH (ref 0.00–0.07)
Basophils Absolute: 0.1 K/uL (ref 0.0–0.1)
Basophils Relative: 1 %
Eosinophils Absolute: 0.3 K/uL (ref 0.0–0.5)
Eosinophils Relative: 3 %
HCT: 39.7 % (ref 36.0–46.0)
Hemoglobin: 12.7 g/dL (ref 12.0–15.0)
Immature Granulocytes: 2 %
Lymphocytes Relative: 43 %
Lymphs Abs: 4.7 K/uL — ABNORMAL HIGH (ref 0.7–4.0)
MCH: 31.3 pg (ref 26.0–34.0)
MCHC: 32 g/dL (ref 30.0–36.0)
MCV: 97.8 fL (ref 80.0–100.0)
Monocytes Absolute: 1 K/uL (ref 0.1–1.0)
Monocytes Relative: 9 %
Neutro Abs: 4.6 K/uL (ref 1.7–7.7)
Neutrophils Relative %: 42 %
Platelets: 252 K/uL (ref 150–400)
RBC: 4.06 MIL/uL (ref 3.87–5.11)
RDW: 13.6 % (ref 11.5–15.5)
WBC: 10.9 K/uL — ABNORMAL HIGH (ref 4.0–10.5)
nRBC: 0 % (ref 0.0–0.2)

## 2023-08-14 LAB — COMPREHENSIVE METABOLIC PANEL WITH GFR
ALT: 25 U/L (ref 0–44)
AST: 27 U/L (ref 15–41)
Albumin: 3.4 g/dL — ABNORMAL LOW (ref 3.5–5.0)
Alkaline Phosphatase: 96 U/L (ref 38–126)
Anion gap: 13 (ref 5–15)
BUN: 9 mg/dL (ref 6–20)
CO2: 18 mmol/L — ABNORMAL LOW (ref 22–32)
Calcium: 8.7 mg/dL — ABNORMAL LOW (ref 8.9–10.3)
Chloride: 105 mmol/L (ref 98–111)
Creatinine, Ser: 0.9 mg/dL (ref 0.44–1.00)
GFR, Estimated: >60 mL/min (ref >60)
Glucose, Bld: 100 mg/dL — ABNORMAL HIGH (ref 70–99)
Potassium: 3.4 mmol/L — ABNORMAL LOW (ref 3.5–5.1)
Sodium: 136 mmol/L (ref 135–145)
Total Bilirubin: 0.3 mg/dL (ref 0.0–1.2)
Total Protein: 6.8 g/dL (ref 6.5–8.1)

## 2023-08-14 LAB — I-STAT CHEM 8, ED
BUN: 8 mg/dL (ref 6–20)
Calcium, Ion: 1.01 mmol/L — ABNORMAL LOW (ref 1.15–1.40)
Chloride: 109 mmol/L (ref 98–111)
Creatinine, Ser: 0.9 mg/dL (ref 0.44–1.00)
Glucose, Bld: 101 mg/dL — ABNORMAL HIGH (ref 70–99)
HCT: 41 % (ref 36.0–46.0)
Hemoglobin: 13.9 g/dL (ref 12.0–15.0)
Potassium: 3.4 mmol/L — ABNORMAL LOW (ref 3.5–5.1)
Sodium: 140 mmol/L (ref 135–145)
TCO2: 20 mmol/L — ABNORMAL LOW (ref 22–32)

## 2023-08-14 LAB — LIPASE, BLOOD: Lipase: 39 U/L (ref 11–51)

## 2023-08-14 LAB — SAMPLE TO BLOOD BANK

## 2023-08-14 LAB — ETHANOL: Alcohol, Ethyl (B): 226 mg/dL — ABNORMAL HIGH (ref <15)

## 2023-08-14 LAB — PROTIME-INR
INR: 1 (ref 0.8–1.2)
Prothrombin Time: 13.6 s (ref 11.4–15.2)

## 2023-08-14 LAB — I-STAT CG4 LACTIC ACID, ED: Lactic Acid, Venous: 2 mmol/L (ref 0.5–1.9)

## 2023-08-14 MED ORDER — TETANUS-DIPHTH-ACELL PERTUSSIS 5-2.5-18.5 LF-MCG/0.5 IM SUSY
0.5000 mL | PREFILLED_SYRINGE | Freq: Once | INTRAMUSCULAR | Status: AC
  Administered 2023-08-14: 0.5 mL via INTRAMUSCULAR
  Filled 2023-08-14: qty 0.5

## 2023-08-14 MED ORDER — LIDOCAINE HCL (PF) 1 % IJ SOLN
5.0000 mL | Freq: Once | INTRAMUSCULAR | Status: AC
  Administered 2023-08-14: 5 mL
  Filled 2023-08-14: qty 5

## 2023-08-14 MED ORDER — IOHEXOL 350 MG/ML SOLN
75.0000 mL | Freq: Once | INTRAVENOUS | Status: AC | PRN
  Administered 2023-08-14: 75 mL via INTRAVENOUS

## 2023-08-14 MED ORDER — CELECOXIB 200 MG PO CAPS: 200.0000 mg | ORAL_CAPSULE | Freq: Two times a day (BID) | ORAL | 0 refills | Status: DC

## 2023-08-14 MED ORDER — FENTANYL CITRATE PF 50 MCG/ML IJ SOSY
50.0000 ug | PREFILLED_SYRINGE | Freq: Once | INTRAMUSCULAR | Status: AC
  Administered 2023-08-14: 50 ug via INTRAVENOUS
  Filled 2023-08-14: qty 1

## 2023-08-14 MED ORDER — CEPHALEXIN 500 MG PO CAPS: 500.0000 mg | ORAL_CAPSULE | Freq: Three times a day (TID) | ORAL | 0 refills | Status: DC

## 2023-08-14 MED ORDER — CEPHALEXIN 250 MG PO CAPS
500.0000 mg | ORAL_CAPSULE | Freq: Once | ORAL | Status: AC
  Administered 2023-08-14: 500 mg via ORAL
  Filled 2023-08-14: qty 2

## 2023-08-14 MED ORDER — LACTATED RINGERS IV BOLUS
1000.0000 mL | Freq: Once | INTRAVENOUS | Status: AC
  Administered 2023-08-14: 1000 mL via INTRAVENOUS

## 2023-08-14 MED ORDER — ONDANSETRON HCL 4 MG/2ML IJ SOLN
4.0000 mg | Freq: Once | INTRAMUSCULAR | Status: AC
  Administered 2023-08-14: 4 mg via INTRAVENOUS
  Filled 2023-08-14: qty 2

## 2023-08-14 MED ORDER — KETOROLAC TROMETHAMINE 15 MG/ML IJ SOLN
15.0000 mg | Freq: Once | INTRAMUSCULAR | Status: AC
  Administered 2023-08-14: 15 mg via INTRAVENOUS
  Filled 2023-08-14: qty 1

## 2023-08-14 NOTE — ED Notes (Signed)
 Patient ambulated to restroom with stand by assist and given meal bag.

## 2023-08-14 NOTE — Discharge Instructions (Addendum)
 Please follow-up with Ortho care, Dr. Agarwala within 48 hours. Stitches will need to be removed within approximately 10 days.  Please take antibiotics prevent infection, please do not remove your splint.

## 2023-08-14 NOTE — ED Notes (Signed)
 Spoke with patients significant other Darryle and states he is going to come pick up the patient.
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
## 2023-08-14 NOTE — Progress Notes (Signed)
 Orthopedic Tech Progress Note Patient Details:  Rebekah Murillo March 26, 1965 [id number]  Patient ID: Rebekah Murillo, female   DOB: Jul 19, 1965, 58 y.o.   MRN: [id number] I attended trauma page. Chandra Dorn PARAS 08/14/2023, 4:28 AM
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
## 2023-08-27 ENCOUNTER — Other Ambulatory Visit: Payer: Self-pay

## 2023-08-27 ENCOUNTER — Ambulatory Visit (HOSPITAL_COMMUNITY)
Admission: EM | Admit: 2023-08-27 | Discharge: 2023-08-27 | Disposition: A | Discharge status: Home / Self Care | Attending: Family Medicine | Admitting: Family Medicine

## 2023-08-27 ENCOUNTER — Encounter (HOSPITAL_COMMUNITY): Payer: Self-pay | Admitting: *Deleted

## 2023-08-27 DIAGNOSIS — L089 Local infection of the skin and subcutaneous tissue, unspecified: Secondary | ICD-10-CM | POA: Diagnosis not present

## 2023-08-27 DIAGNOSIS — Z4802 Encounter for removal of sutures: Secondary | ICD-10-CM

## 2023-08-27 DIAGNOSIS — S62636A Displaced fracture of distal phalanx of right little finger, initial encounter for closed fracture: Secondary | ICD-10-CM | POA: Diagnosis not present

## 2023-08-27 DIAGNOSIS — T148XXA Other injury of unspecified body region, initial encounter: Secondary | ICD-10-CM

## 2023-08-27 MED ORDER — CLINDAMYCIN HCL 300 MG PO CAPS: 300.0000 mg | ORAL_CAPSULE | Freq: Three times a day (TID) | ORAL | 0 refills | Status: AC

## 2023-08-27 MED ORDER — KETOROLAC TROMETHAMINE 30 MG/ML IJ SOLN
30.0000 mg | Freq: Once | INTRAMUSCULAR | Status: AC
  Administered 2023-08-27: 30 mg via INTRAMUSCULAR

## 2023-08-27 MED ORDER — KETOROLAC TROMETHAMINE 30 MG/ML IJ SOLN
INTRAMUSCULAR | Status: AC
Start: 2023-08-27 — End: 2023-08-27
  Filled 2023-08-27: qty 1

## 2023-08-27 MED ORDER — MUPIROCIN 2 % EX OINT: 1.0000 | TOPICAL_OINTMENT | Freq: Two times a day (BID) | CUTANEOUS | 0 refills | Status: AC

## 2023-08-27 MED ORDER — KETOROLAC TROMETHAMINE 10 MG PO TABS: 10.0000 mg | ORAL_TABLET | Freq: Four times a day (QID) | ORAL | 0 refills | Status: AC | PRN

## 2023-08-27 NOTE — Discharge Instructions (Addendum)
 You have been given a shot of Toradol  30 mg today.  Ketorolac  10 mg tablets--take 1 tablet every 6 hours as needed for pain.  This is the same medicine that is in the shot we just gave you  --Take clindamycin  300 mg-- 1 capsule 3 times daily for 7 days  Please remove the bandage and wash your finger with soapy water 2 times daily.  Then put new antibiotic ointment on mupirocin  and a clean bandage.  Wear your splint to protect the finger from further damage  Please see the orthopedic/hand specialist very soon  If the redness and swelling in your finger gets worse, then go to the emergency room.

## 2023-08-27 NOTE — ED Notes (Signed)
 One suture removed from RT small finger.
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
## 2023-08-27 NOTE — ED Triage Notes (Addendum)
 Pt presents to have sutures on rt small finger removed.

## 2023-10-23 ENCOUNTER — Other Ambulatory Visit (HOSPITAL_BASED_OUTPATIENT_CLINIC_OR_DEPARTMENT_OTHER): Payer: Self-pay | Admitting: Adult Medicine

## 2023-10-23 DIAGNOSIS — M8589 Other specified disorders of bone density and structure, multiple sites: Secondary | ICD-10-CM

## 2023-12-06 ENCOUNTER — Other Ambulatory Visit

## 2023-12-22 ENCOUNTER — Encounter (HOSPITAL_BASED_OUTPATIENT_CLINIC_OR_DEPARTMENT_OTHER): Payer: Self-pay
